# Patient Record
Sex: Male | Born: 1950 | Race: Black or African American | Hispanic: No | Marital: Single | Smoking: Current every day smoker
Health system: Southern US, Community
[De-identification: ages and names within clinical notes are randomized; demographics above are authoritative.]

## PROBLEM LIST (undated history)

## (undated) DIAGNOSIS — I639 Cerebral infarction, unspecified: Secondary | ICD-10-CM

## (undated) HISTORY — PX: TOTAL HIP ARTHROPLASTY: SHX124

---

## 1998-09-02 ENCOUNTER — Emergency Department (HOSPITAL_COMMUNITY): Admission: EM | Admit: 1998-09-02 | Discharge: 1998-09-02 | Payer: Self-pay | Admitting: Internal Medicine

## 1998-10-21 ENCOUNTER — Emergency Department (HOSPITAL_COMMUNITY): Admission: EM | Admit: 1998-10-21 | Discharge: 1998-10-21 | Payer: Self-pay | Admitting: Emergency Medicine

## 1998-11-23 ENCOUNTER — Encounter: Payer: Self-pay | Admitting: Family Medicine

## 1998-11-23 ENCOUNTER — Ambulatory Visit (HOSPITAL_COMMUNITY): Admission: RE | Admit: 1998-11-23 | Discharge: 1998-11-23 | Payer: Self-pay | Admitting: Family Medicine

## 1999-02-17 ENCOUNTER — Ambulatory Visit: Admission: RE | Admit: 1999-02-17 | Discharge: 1999-02-17 | Payer: Self-pay | Admitting: Pulmonary Disease

## 1999-11-20 ENCOUNTER — Emergency Department (HOSPITAL_COMMUNITY): Admission: EM | Admit: 1999-11-20 | Discharge: 1999-11-20 | Payer: Self-pay | Admitting: *Deleted

## 2002-07-24 ENCOUNTER — Emergency Department (HOSPITAL_COMMUNITY): Admission: EM | Admit: 2002-07-24 | Discharge: 2002-07-24 | Payer: Self-pay | Admitting: Emergency Medicine

## 2002-07-25 ENCOUNTER — Encounter: Payer: Self-pay | Admitting: Emergency Medicine

## 2002-07-25 ENCOUNTER — Emergency Department (HOSPITAL_COMMUNITY): Admission: EM | Admit: 2002-07-25 | Discharge: 2002-07-25 | Payer: Self-pay | Admitting: Emergency Medicine

## 2003-02-01 ENCOUNTER — Encounter: Payer: Self-pay | Admitting: Family Medicine

## 2003-02-01 ENCOUNTER — Ambulatory Visit (HOSPITAL_COMMUNITY): Admission: RE | Admit: 2003-02-01 | Discharge: 2003-02-01 | Payer: Self-pay | Admitting: Family Medicine

## 2004-01-23 ENCOUNTER — Emergency Department (HOSPITAL_COMMUNITY): Admission: EM | Admit: 2004-01-23 | Discharge: 2004-01-23 | Payer: Self-pay | Admitting: Emergency Medicine

## 2004-03-30 ENCOUNTER — Inpatient Hospital Stay (HOSPITAL_COMMUNITY): Admission: EM | Admit: 2004-03-30 | Discharge: 2004-04-05 | Payer: Self-pay | Admitting: Emergency Medicine

## 2004-03-30 ENCOUNTER — Ambulatory Visit: Payer: Self-pay | Admitting: Oncology

## 2004-04-05 ENCOUNTER — Ambulatory Visit: Payer: Self-pay | Admitting: Oncology

## 2004-04-05 ENCOUNTER — Inpatient Hospital Stay (HOSPITAL_COMMUNITY)
Admission: RE | Admit: 2004-04-05 | Discharge: 2004-04-20 | Payer: Self-pay | Admitting: Physical Medicine & Rehabilitation

## 2004-04-05 ENCOUNTER — Ambulatory Visit: Payer: Self-pay | Admitting: Physical Medicine & Rehabilitation

## 2004-05-24 ENCOUNTER — Ambulatory Visit: Payer: Self-pay | Admitting: Oncology

## 2004-05-25 ENCOUNTER — Encounter
Admission: RE | Admit: 2004-05-25 | Discharge: 2004-08-23 | Payer: Self-pay | Admitting: Physical Medicine & Rehabilitation

## 2004-05-26 ENCOUNTER — Ambulatory Visit: Payer: Self-pay | Admitting: Physical Medicine & Rehabilitation

## 2004-07-09 ENCOUNTER — Ambulatory Visit: Payer: Self-pay | Admitting: Oncology

## 2004-08-25 ENCOUNTER — Ambulatory Visit (HOSPITAL_COMMUNITY): Payer: Self-pay | Admitting: Psychiatry

## 2004-08-27 ENCOUNTER — Ambulatory Visit: Payer: Self-pay | Admitting: Oncology

## 2004-10-25 ENCOUNTER — Ambulatory Visit: Payer: Self-pay | Admitting: Oncology

## 2004-11-24 ENCOUNTER — Encounter
Admission: RE | Admit: 2004-11-24 | Discharge: 2005-02-22 | Payer: Self-pay | Admitting: Physical Medicine & Rehabilitation

## 2004-12-13 ENCOUNTER — Ambulatory Visit: Payer: Self-pay | Admitting: Oncology

## 2005-01-28 ENCOUNTER — Ambulatory Visit: Payer: Self-pay | Admitting: Oncology

## 2005-04-01 ENCOUNTER — Ambulatory Visit: Payer: Self-pay | Admitting: Oncology

## 2005-05-20 ENCOUNTER — Ambulatory Visit: Payer: Self-pay | Admitting: Oncology

## 2005-08-22 ENCOUNTER — Ambulatory Visit: Payer: Self-pay | Admitting: Oncology

## 2005-08-26 LAB — PROTIME-INR
INR: 3.1 (ref 2.00–3.50)
Protime: 21.1 Seconds — ABNORMAL HIGH (ref 10.6–13.4)

## 2005-08-26 LAB — MORPHOLOGY

## 2005-08-26 LAB — CBC WITH DIFFERENTIAL/PLATELET
Basophils Absolute: 0.1 10*3/uL (ref 0.0–0.1)
EOS%: 1.2 % (ref 0.0–7.0)
HCT: 40.9 % (ref 38.7–49.9)
HGB: 13.8 g/dL (ref 13.0–17.1)
MONO#: 0.6 10*3/uL (ref 0.1–0.9)
NEUT#: 1.5 10*3/uL (ref 1.5–6.5)
NEUT%: 32.9 % — ABNORMAL LOW (ref 40.0–75.0)
RDW: 16 % — ABNORMAL HIGH (ref 11.2–14.6)
WBC: 4.5 10*3/uL (ref 4.0–10.0)
lymph#: 2.3 10*3/uL (ref 0.9–3.3)

## 2005-08-30 LAB — COMPREHENSIVE METABOLIC PANEL
Albumin: 4.5 g/dL (ref 3.5–5.2)
BUN: 17 mg/dL (ref 6–23)
CO2: 22 mEq/L (ref 19–32)
Calcium: 9 mg/dL (ref 8.4–10.5)
Chloride: 104 mEq/L (ref 96–112)
Glucose, Bld: 85 mg/dL (ref 70–99)
Potassium: 3.6 mEq/L (ref 3.5–5.3)
Sodium: 142 mEq/L (ref 135–145)
Total Protein: 7.3 g/dL (ref 6.0–8.3)

## 2005-09-01 LAB — JAK2 GENOTYPR

## 2005-10-05 ENCOUNTER — Ambulatory Visit: Payer: Self-pay | Admitting: Oncology

## 2005-10-07 LAB — CBC WITH DIFFERENTIAL/PLATELET
BASO%: 0.6 % (ref 0.0–2.0)
EOS%: 0.8 % (ref 0.0–7.0)
HCT: 40.3 % (ref 38.7–49.9)
MCH: 33.8 pg — ABNORMAL HIGH (ref 28.0–33.4)
MCHC: 33.8 g/dL (ref 32.0–35.9)
NEUT%: 45.4 % (ref 40.0–75.0)
RDW: 17.5 % — ABNORMAL HIGH (ref 11.2–14.6)
lymph#: 1.8 10*3/uL (ref 0.9–3.3)

## 2005-10-07 LAB — PROTIME-INR

## 2005-10-07 LAB — PROTHROMBIN TIME
INR: 4.3 — ABNORMAL HIGH (ref 0.0–1.5)
Prothrombin Time: 40.8 seconds — ABNORMAL HIGH (ref 11.6–15.2)

## 2005-10-07 LAB — MORPHOLOGY

## 2005-10-17 LAB — PROTHROMBIN TIME
INR: 6.1 (ref 0.0–1.5)
Prothrombin Time: 54.1 seconds — ABNORMAL HIGH (ref 11.6–15.2)

## 2005-10-17 LAB — PROTIME-INR

## 2005-10-19 LAB — PROTIME-INR: Protime: 24.2 Seconds — ABNORMAL HIGH (ref 10.6–13.4)

## 2005-10-28 LAB — PROTIME-INR: INR: 1.3 — ABNORMAL LOW (ref 2.00–3.50)

## 2005-10-31 LAB — PROTIME-INR: INR: 4 (ref 2.00–3.50)

## 2005-11-29 ENCOUNTER — Ambulatory Visit: Payer: Self-pay | Admitting: Oncology

## 2005-12-02 LAB — CBC WITH DIFFERENTIAL/PLATELET
BASO%: 0.7 % (ref 0.0–2.0)
EOS%: 0.7 % (ref 0.0–7.0)
HCT: 43.2 % (ref 38.7–49.9)
LYMPH%: 52.4 % — ABNORMAL HIGH (ref 14.0–48.0)
MCH: 34 pg — ABNORMAL HIGH (ref 28.0–33.4)
MCHC: 34.3 g/dL (ref 32.0–35.9)
MCV: 99.1 fL — ABNORMAL HIGH (ref 81.6–98.0)
MONO%: 12 % (ref 0.0–13.0)
NEUT%: 34.2 % — ABNORMAL LOW (ref 40.0–75.0)
Platelets: 239 10*3/uL (ref 145–400)
RBC: 4.36 10*6/uL (ref 4.20–5.71)
WBC: 4.1 10*3/uL (ref 4.0–10.0)

## 2005-12-02 LAB — PROTIME-INR

## 2005-12-02 LAB — MORPHOLOGY: PLT EST: ADEQUATE

## 2005-12-06 LAB — PROTIME-INR
INR: 1.3 — ABNORMAL LOW (ref 2.00–3.50)
Protime: 13.9 Seconds — ABNORMAL HIGH (ref 10.6–13.4)

## 2005-12-09 LAB — PROTIME-INR: Protime: 16.2 Seconds — ABNORMAL HIGH (ref 10.6–13.4)

## 2005-12-12 LAB — PROTIME-INR: Protime: 16.5 Seconds — ABNORMAL HIGH (ref 10.6–13.4)

## 2005-12-19 LAB — PROTIME-INR: INR: 3 (ref 2.00–3.50)

## 2005-12-23 LAB — PROTIME-INR

## 2005-12-23 LAB — PROTHROMBIN TIME
INR: 3.9 — ABNORMAL HIGH (ref 0.0–1.5)
Prothrombin Time: 40.6 seconds — ABNORMAL HIGH (ref 11.6–15.2)

## 2006-02-07 ENCOUNTER — Ambulatory Visit: Payer: Self-pay | Admitting: Oncology

## 2006-02-07 LAB — COMPREHENSIVE METABOLIC PANEL
ALT: 17 U/L (ref 0–40)
AST: 29 U/L (ref 0–37)
Albumin: 4.5 g/dL (ref 3.5–5.2)
CO2: 23 mEq/L (ref 19–32)
Calcium: 9 mg/dL (ref 8.4–10.5)
Chloride: 108 mEq/L (ref 96–112)
Creatinine, Ser: 0.89 mg/dL (ref 0.40–1.50)
Potassium: 3.6 mEq/L (ref 3.5–5.3)

## 2006-02-07 LAB — CBC WITH DIFFERENTIAL/PLATELET
BASO%: 1.7 % (ref 0.0–2.0)
LYMPH%: 62.7 % — ABNORMAL HIGH (ref 14.0–48.0)
MCHC: 33.7 g/dL (ref 32.0–35.9)
MONO#: 0.4 10*3/uL (ref 0.1–0.9)
Platelets: 243 10*3/uL (ref 145–400)
RBC: 3.88 10*6/uL — ABNORMAL LOW (ref 4.20–5.71)
RDW: 17.8 % — ABNORMAL HIGH (ref 11.2–14.6)
WBC: 4 10*3/uL (ref 4.0–10.0)

## 2006-02-07 LAB — MORPHOLOGY

## 2006-02-07 LAB — LACTATE DEHYDROGENASE: LDH: 191 U/L (ref 94–250)

## 2006-03-30 ENCOUNTER — Ambulatory Visit: Payer: Self-pay | Admitting: Oncology

## 2006-06-15 ENCOUNTER — Ambulatory Visit: Payer: Self-pay | Admitting: Oncology

## 2006-12-27 ENCOUNTER — Ambulatory Visit: Payer: Self-pay | Admitting: Oncology

## 2007-10-15 ENCOUNTER — Ambulatory Visit: Payer: Self-pay | Admitting: Oncology

## 2007-11-05 LAB — COMPREHENSIVE METABOLIC PANEL
ALT: 36 U/L (ref 0–53)
AST: 58 U/L — ABNORMAL HIGH (ref 0–37)
Albumin: 5 g/dL (ref 3.5–5.2)
Alkaline Phosphatase: 84 U/L (ref 39–117)
Chloride: 96 mEq/L (ref 96–112)
Potassium: 3.9 mEq/L (ref 3.5–5.3)
Sodium: 137 mEq/L (ref 135–145)
Total Protein: 7.6 g/dL (ref 6.0–8.3)

## 2007-11-05 LAB — CBC WITH DIFFERENTIAL/PLATELET
Basophils Absolute: 0 10*3/uL (ref 0.0–0.1)
EOS%: 0.8 % (ref 0.0–7.0)
Eosinophils Absolute: 0 10*3/uL (ref 0.0–0.5)
HGB: 14.6 g/dL (ref 13.0–17.1)
LYMPH%: 45.9 % (ref 14.0–48.0)
MCH: 35.3 pg — ABNORMAL HIGH (ref 28.0–33.4)
MCV: 101.6 fL — ABNORMAL HIGH (ref 81.6–98.0)
MONO%: 8.7 % (ref 0.0–13.0)
NEUT#: 1.6 10*3/uL (ref 1.5–6.5)
Platelets: 215 10*3/uL (ref 145–400)
RBC: 4.13 10*6/uL — ABNORMAL LOW (ref 4.20–5.71)

## 2007-11-05 LAB — PROTIME-INR
INR: 1 — ABNORMAL LOW (ref 2.00–3.50)
Protime: 12 Seconds (ref 10.6–13.4)

## 2007-11-23 LAB — CBC WITH DIFFERENTIAL/PLATELET
BASO%: 0.1 % (ref 0.0–2.0)
Basophils Absolute: 0 10*3/uL (ref 0.0–0.1)
EOS%: 0.8 % (ref 0.0–7.0)
HCT: 40 % (ref 38.7–49.9)
HGB: 13.9 g/dL (ref 13.0–17.1)
LYMPH%: 47.5 % (ref 14.0–48.0)
MCH: 34.6 pg — ABNORMAL HIGH (ref 28.0–33.4)
MCHC: 34.8 g/dL (ref 32.0–35.9)
MCV: 99.4 fL — ABNORMAL HIGH (ref 81.6–98.0)
MONO%: 7.7 % (ref 0.0–13.0)
NEUT%: 43.9 % (ref 40.0–75.0)
lymph#: 2.3 10*3/uL (ref 0.9–3.3)

## 2007-11-23 LAB — PROTIME-INR

## 2007-11-27 ENCOUNTER — Ambulatory Visit: Payer: Self-pay | Admitting: Oncology

## 2007-12-07 LAB — COMPREHENSIVE METABOLIC PANEL
ALT: 25 U/L (ref 0–53)
AST: 39 U/L — ABNORMAL HIGH (ref 0–37)
Albumin: 4.7 g/dL (ref 3.5–5.2)
Alkaline Phosphatase: 72 U/L (ref 39–117)
BUN: 12 mg/dL (ref 6–23)
Chloride: 103 mEq/L (ref 96–112)
Potassium: 3.7 mEq/L (ref 3.5–5.3)

## 2007-12-07 LAB — CBC WITH DIFFERENTIAL/PLATELET
Basophils Absolute: 0 10*3/uL (ref 0.0–0.1)
Eosinophils Absolute: 0 10*3/uL (ref 0.0–0.5)
HCT: 37.8 % — ABNORMAL LOW (ref 38.7–49.9)
HGB: 12.8 g/dL — ABNORMAL LOW (ref 13.0–17.1)
LYMPH%: 47.5 % (ref 14.0–48.0)
MCV: 100.4 fL — ABNORMAL HIGH (ref 81.6–98.0)
MONO#: 0.4 10*3/uL (ref 0.1–0.9)
MONO%: 8.9 % (ref 0.0–13.0)
NEUT#: 1.8 10*3/uL (ref 1.5–6.5)
NEUT%: 42.3 % (ref 40.0–75.0)
Platelets: 255 10*3/uL (ref 145–400)
RBC: 3.76 10*6/uL — ABNORMAL LOW (ref 4.20–5.71)
WBC: 4.2 10*3/uL (ref 4.0–10.0)

## 2007-12-07 LAB — PROTIME-INR
INR: 2.7 (ref 2.00–3.50)
Protime: 32.4 Seconds — ABNORMAL HIGH (ref 10.6–13.4)

## 2007-12-07 LAB — MORPHOLOGY

## 2008-01-03 ENCOUNTER — Ambulatory Visit: Admission: RE | Admit: 2008-01-03 | Discharge: 2008-01-03 | Payer: Self-pay | Admitting: Family Medicine

## 2008-01-30 ENCOUNTER — Ambulatory Visit: Payer: Self-pay | Admitting: Oncology

## 2009-04-14 ENCOUNTER — Ambulatory Visit: Payer: Self-pay | Admitting: Cardiology

## 2009-04-14 ENCOUNTER — Inpatient Hospital Stay (HOSPITAL_COMMUNITY): Admission: EM | Admit: 2009-04-14 | Discharge: 2009-04-17 | Payer: Self-pay | Admitting: Emergency Medicine

## 2009-04-15 ENCOUNTER — Encounter (INDEPENDENT_AMBULATORY_CARE_PROVIDER_SITE_OTHER): Payer: Self-pay | Admitting: Internal Medicine

## 2009-04-15 ENCOUNTER — Ambulatory Visit: Payer: Self-pay | Admitting: Vascular Surgery

## 2009-04-24 ENCOUNTER — Ambulatory Visit: Payer: Self-pay | Admitting: Oncology

## 2009-05-20 ENCOUNTER — Ambulatory Visit: Payer: Self-pay | Admitting: Oncology

## 2010-08-17 LAB — CBC
Hemoglobin: 13.5 g/dL (ref 13.0–17.0)
MCHC: 34.4 g/dL (ref 30.0–36.0)
MCV: 106.7 fL — ABNORMAL HIGH (ref 78.0–100.0)
Platelets: 185 10*3/uL (ref 150–400)
RBC: 3.77 MIL/uL — ABNORMAL LOW (ref 4.22–5.81)

## 2010-08-17 LAB — PROTIME-INR
INR: 2.25 — ABNORMAL HIGH (ref 0.00–1.49)
Prothrombin Time: 19 seconds — ABNORMAL HIGH (ref 11.6–15.2)
Prothrombin Time: 24.7 seconds — ABNORMAL HIGH (ref 11.6–15.2)

## 2010-08-17 LAB — BASIC METABOLIC PANEL
BUN: 7 mg/dL (ref 6–23)
CO2: 26 mEq/L (ref 19–32)
CO2: 29 mEq/L (ref 19–32)
Calcium: 9.1 mg/dL (ref 8.4–10.5)
Chloride: 102 mEq/L (ref 96–112)
Creatinine, Ser: 0.89 mg/dL (ref 0.4–1.5)
GFR calc Af Amer: >60 mL/min (ref >60)
GFR calc non Af Amer: >60 mL/min (ref >60)
Sodium: 141 mEq/L (ref 135–145)

## 2010-08-17 LAB — LIPID PANEL
Cholesterol: 200 mg/dL (ref 0–200)
LDL Cholesterol: 131 mg/dL — ABNORMAL HIGH (ref 0–99)
Triglycerides: 49 mg/dL (ref <150)

## 2010-08-18 LAB — PROTIME-INR
INR: 2.06 — ABNORMAL HIGH (ref 0.00–1.49)
Prothrombin Time: 23 seconds — ABNORMAL HIGH (ref 11.6–15.2)

## 2010-08-18 LAB — URINE MICROSCOPIC-ADD ON

## 2010-08-18 LAB — CBC
MCHC: 33.8 g/dL (ref 30.0–36.0)
Platelets: 199 10*3/uL (ref 150–400)
RBC: 3.98 MIL/uL — ABNORMAL LOW (ref 4.22–5.81)

## 2010-08-18 LAB — URINALYSIS, ROUTINE W REFLEX MICROSCOPIC
Glucose, UA: NEGATIVE mg/dL
Ketones, ur: >80 mg/dL — AB
Leukocytes, UA: NEGATIVE
Specific Gravity, Urine: 1.029 (ref 1.005–1.030)
pH: 6 (ref 5.0–8.0)

## 2010-08-18 LAB — COMPREHENSIVE METABOLIC PANEL
BUN: 11 mg/dL (ref 6–23)
Calcium: 9.4 mg/dL (ref 8.4–10.5)
Creatinine, Ser: 0.97 mg/dL (ref 0.4–1.5)
GFR calc non Af Amer: >60 mL/min (ref >60)
Glucose, Bld: 69 mg/dL — ABNORMAL LOW (ref 70–99)
Sodium: 141 mEq/L (ref 135–145)
Total Protein: 7.6 g/dL (ref 6.0–8.3)

## 2010-08-18 LAB — DIFFERENTIAL
Lymphocytes Relative: 45 % (ref 12–46)
Lymphs Abs: 1.5 10*3/uL (ref 0.7–4.0)
Monocytes Relative: 13 % — ABNORMAL HIGH (ref 3–12)
Neutro Abs: 1.4 10*3/uL — ABNORMAL LOW (ref 1.7–7.7)
Neutrophils Relative %: 41 % — ABNORMAL LOW (ref 43–77)

## 2010-08-18 LAB — CK TOTAL AND CKMB (NOT AT ARMC)
CK, MB: 1.5 ng/mL (ref 0.3–4.0)
Total CK: 122 U/L (ref 7–232)

## 2010-08-18 LAB — APTT: aPTT: 33 seconds (ref 24–37)

## 2010-08-18 LAB — HEMOGLOBIN A1C
Hgb A1c MFr Bld: 5.4 % (ref 4.6–6.1)
Mean Plasma Glucose: 108 mg/dL

## 2010-09-28 ENCOUNTER — Inpatient Hospital Stay (HOSPITAL_COMMUNITY)
Admission: EM | Admit: 2010-09-28 | Discharge: 2010-10-03 | DRG: 481 | Discharge status: Against Medical Advice | Payer: Medicare Other | Attending: Internal Medicine | Admitting: Internal Medicine

## 2010-09-28 ENCOUNTER — Emergency Department (HOSPITAL_COMMUNITY): Payer: Medicare Other

## 2010-09-28 DIAGNOSIS — Y92009 Unspecified place in unspecified non-institutional (private) residence as the place of occurrence of the external cause: Secondary | ICD-10-CM

## 2010-09-28 DIAGNOSIS — F1010 Alcohol abuse, uncomplicated: Secondary | ICD-10-CM | POA: Diagnosis present

## 2010-09-28 DIAGNOSIS — Z8673 Personal history of transient ischemic attack (TIA), and cerebral infarction without residual deficits: Secondary | ICD-10-CM

## 2010-09-28 DIAGNOSIS — W010XXA Fall on same level from slipping, tripping and stumbling without subsequent striking against object, initial encounter: Secondary | ICD-10-CM | POA: Diagnosis present

## 2010-09-28 DIAGNOSIS — D696 Thrombocytopenia, unspecified: Secondary | ICD-10-CM | POA: Diagnosis present

## 2010-09-28 DIAGNOSIS — F17200 Nicotine dependence, unspecified, uncomplicated: Secondary | ICD-10-CM | POA: Diagnosis present

## 2010-09-28 DIAGNOSIS — D62 Acute posthemorrhagic anemia: Secondary | ICD-10-CM | POA: Diagnosis not present

## 2010-09-28 DIAGNOSIS — E876 Hypokalemia: Secondary | ICD-10-CM | POA: Diagnosis not present

## 2010-09-28 DIAGNOSIS — D45 Polycythemia vera: Secondary | ICD-10-CM | POA: Diagnosis present

## 2010-09-28 DIAGNOSIS — J322 Chronic ethmoidal sinusitis: Secondary | ICD-10-CM | POA: Diagnosis present

## 2010-09-28 DIAGNOSIS — Z91199 Patient's noncompliance with other medical treatment and regimen due to unspecified reason: Secondary | ICD-10-CM

## 2010-09-28 DIAGNOSIS — Z86718 Personal history of other venous thrombosis and embolism: Secondary | ICD-10-CM

## 2010-09-28 DIAGNOSIS — Z7901 Long term (current) use of anticoagulants: Secondary | ICD-10-CM

## 2010-09-28 DIAGNOSIS — Z9119 Patient's noncompliance with other medical treatment and regimen: Secondary | ICD-10-CM

## 2010-09-28 DIAGNOSIS — S7292XA Unspecified fracture of left femur, initial encounter for closed fracture: Secondary | ICD-10-CM

## 2010-09-28 DIAGNOSIS — S72143A Displaced intertrochanteric fracture of unspecified femur, initial encounter for closed fracture: Principal | ICD-10-CM | POA: Diagnosis present

## 2010-09-28 LAB — ABO/RH: ABO/RH(D): A POS

## 2010-09-28 LAB — CBC
MCH: 34.1 pg — ABNORMAL HIGH (ref 26.0–34.0)
MCV: 95.5 fL (ref 78.0–100.0)
Platelets: 185 10*3/uL (ref 150–400)
RBC: 3.75 MIL/uL — ABNORMAL LOW (ref 4.22–5.81)
RDW: 14.6 % (ref 11.5–15.5)
WBC: 4.5 10*3/uL (ref 4.0–10.5)

## 2010-09-28 LAB — APTT: aPTT: 26 seconds (ref 24–37)

## 2010-09-28 LAB — DIFFERENTIAL
Basophils Absolute: 0 10*3/uL (ref 0.0–0.1)
Eosinophils Absolute: 0 10*3/uL (ref 0.0–0.7)
Lymphocytes Relative: 40 % (ref 12–46)
Monocytes Absolute: 0.4 10*3/uL (ref 0.1–1.0)
Neutrophils Relative %: 51 % (ref 43–77)

## 2010-09-28 LAB — COMPREHENSIVE METABOLIC PANEL
ALT: 49 U/L (ref 0–53)
AST: 85 U/L — ABNORMAL HIGH (ref 0–37)
CO2: 26 mEq/L (ref 19–32)
Calcium: 8.8 mg/dL (ref 8.4–10.5)
Chloride: 101 mEq/L (ref 96–112)
GFR calc Af Amer: >60 mL/min (ref >60)
GFR calc non Af Amer: >60 mL/min (ref >60)
Glucose, Bld: 101 mg/dL — ABNORMAL HIGH (ref 70–99)
Sodium: 140 mEq/L (ref 135–145)
Total Bilirubin: 0.5 mg/dL (ref 0.3–1.2)

## 2010-09-28 LAB — PROTIME-INR: INR: 0.98 (ref 0.00–1.49)

## 2010-09-28 LAB — TYPE AND SCREEN: ABO/RH(D): A POS

## 2010-09-29 ENCOUNTER — Inpatient Hospital Stay (HOSPITAL_COMMUNITY): Payer: Medicare Other

## 2010-09-29 LAB — SURGICAL PCR SCREEN
MRSA, PCR: NEGATIVE
Staphylococcus aureus: NEGATIVE

## 2010-09-29 LAB — URINE MICROSCOPIC-ADD ON

## 2010-09-29 LAB — URINALYSIS, ROUTINE W REFLEX MICROSCOPIC
Glucose, UA: NEGATIVE mg/dL
Hgb urine dipstick: NEGATIVE
Ketones, ur: 15 mg/dL — AB
Protein, ur: NEGATIVE mg/dL
Urobilinogen, UA: 1 mg/dL (ref 0.0–1.0)

## 2010-09-29 NOTE — H&P (Signed)
[name], [name]                 ACCOUNT NO.:  [id number]  MEDICAL RECORD NO.:  [id number]           PATIENT TYPE:  E  LOCATION:  [facility]                         FACILITY:  [facility]  PHYSICIAN:  Jonny Ruiz, MD    DATE OF BIRTH:  04/10/1951  DATE OF ADMISSION:  09/28/2010 DATE OF DISCHARGE:                             HISTORY & PHYSICAL   PRIMARY CARE PHYSICIAN:  Dr. Ronne Binning.  HEMATOLOGIST:  Levert Feinstein, MD, FACP  CHIEF COMPLAINT:  Hip fracture.  HISTORY OF PRESENT ILLNESS:  This is a 60 year old African American man who came to the hospital with a chief complaint of a fall and evaluation in the ED revealed a left hip fracture.  The patient's injury occurred prior to arrival to the ED.  The patient said that he got up and tripped, and fell, no head injuries.  Denies prior headache, dizziness, chest pain, or palpitations.  The patient has no history of falls.  The patient noticed severe pain on the left hip and inability to ambulate soon after the fall.  The patient had already received morphine sulfate. By the time I saw him, however, his pain was still at a scale of 6/10.  PAST MEDICAL HISTORY: 1. Admitted on May 04, 2009, for generalized weakness with a     negative evaluation for cerebrovascular accident. 2. Chronic leukopenia/polycythemia vera.  Hypercoagulable. 3. Stroke 2010 on warfarin therapy. 4. Superficial venous thrombus with bilateral subarachnoid hemorrhages     and right lower extremity DVT, in 2006.  MEDICATIONS: 1. Tramadol 50 mg q.6 hours for pain. 2. Coumadin. 3. Topamax 100 mg 3 times a day.  ALLERGIES:  No known drug allergies.  FAMILY HISTORY:  The patient does not know well the health conditions of his [name] and states his other family members are healthy.  SOCIAL HISTORY:  Divorced.  [name].  Smokes a pack of cigarettes per week.  Alcohol social.  Denies drug use.  The patient is on disability.  REVIEW OF  SYSTEMS:  CONSTITUTIONAL:  The patient has been feeling well lately and denies fever, chills, or night sweats.  No anorexia, fatigue, or malaise.  No weight loss or weight gain. ENT:  Denies tinnitus or vertigo.  No rhinorrhea or epistaxis.  No sore throat. CARDIOVASCULAR SYSTEM:  Denies chest pain, shortness of breath, palpitations, orthopnea, nocturia, or PND, although it is precipitated by coma. RESPIRATORY:  Denies cough, shortness of breath, wheezing, or recent infections. GASTROINTESTINAL:  Denies anorexia, nausea, vomiting, diarrhea, or constipation.  No hematochezia or melena. GENITOURINARY:  Admits frequent feeling of urination.  Denies dysuria, nocturia, dribbling, or hesitancy.  PHYSICAL EXAMINATION:  VITAL SIGNS:  Temperature 98.3, heart rate 105, blood pressure 104/64, and respirations 16. GENERAL APPEARANCE:  Thin black male in no acute distress; however, appears to be in pain. HEENT:  Unremarkable. NECK:  Supple without JVD.  No lymphadenopathy or masses palpable. HEART:  Regular S1 and S2 without gallops, murmurs, or rubs. LUNGS:  Clear to auscultation. ABDOMEN:  Nondistended.  Soft and nontender without organomegaly or masses palpable. EXTREMITIES:  Left hip is shortened and rotated.  NEUROLOGIC:  Cranial nerves II through XII intact.  No motor deficits in the upper extremities.  Right patellar reflexes normal.  No Babinski.  LABORATORY ASSESSMENT:  INR 0.98.  Basic metabolic panel normal except for albumin 3.6.  IMPRESSION: 1. Hip fracture. 2. Fall, appears to be accidental. 3. History of stroke. 4. Polycythemia vera with anticoagulation.  PLAN:  The patient will be admitted to inpatient.  He will be assigned to team IV.  He is a full code.  Vitals will be taken per shift.  He will be bedrest for the next 24 hours.  Consultation with ortho already placed by ED physician.  Obtain CBC and TSH.  UA for analgesia.  The patient will be on Tylenol 650 mg q.4 hours  for mild pain with hydrocodone 2 tabs q.4 p.r.n. for pain.  He might receive Dilaudid 1 mg q.4 IV p.r.n. severe pain.          ______________________________ Jonny Ruiz, MD     GL/MEDQ  D:  09/28/2010  T:  09/28/2010  Job:  [id number]  Electronically Signed by Jonny Ruiz MD on 09/29/2010 03:17:00 PM

## 2010-09-30 ENCOUNTER — Inpatient Hospital Stay (HOSPITAL_COMMUNITY): Payer: Medicare Other

## 2010-09-30 LAB — CBC
MCV: 96.8 fL (ref 78.0–100.0)
Platelets: 132 10*3/uL — ABNORMAL LOW (ref 150–400)
RBC: 2.8 MIL/uL — ABNORMAL LOW (ref 4.22–5.81)
RDW: 14 % (ref 11.5–15.5)
WBC: 5.5 10*3/uL (ref 4.0–10.5)

## 2010-10-01 ENCOUNTER — Inpatient Hospital Stay (HOSPITAL_COMMUNITY): Payer: Medicare Other

## 2010-10-01 LAB — BASIC METABOLIC PANEL
CO2: 30 mEq/L (ref 19–32)
Calcium: 8.7 mg/dL (ref 8.4–10.5)
GFR calc Af Amer: >60 mL/min (ref >60)
GFR calc non Af Amer: >60 mL/min (ref >60)
Potassium: 3.1 mEq/L — ABNORMAL LOW (ref 3.5–5.1)
Sodium: 135 mEq/L (ref 135–145)

## 2010-10-01 LAB — CBC
HCT: 23 % — ABNORMAL LOW (ref 39.0–52.0)
Hemoglobin: 8.3 g/dL — ABNORMAL LOW (ref 13.0–17.0)
MCHC: 36.1 g/dL — ABNORMAL HIGH (ref 30.0–36.0)
WBC: 4.8 10*3/uL (ref 4.0–10.5)

## 2010-10-01 NOTE — Discharge Summary (Signed)
NAMEKAULANA, [name]                 ACCOUNT NO.:  [id number]   MEDICAL RECORD NO.:  [id number]          PATIENT TYPE:  IPS   LOCATION:  4038                         FACILITY:  [facility]   PHYSICIAN:  Ellwood Dense, M.D.   DATE OF BIRTH:  04/10/1951   DATE OF ADMISSION:  04/05/2004  DATE OF DISCHARGE:  04/20/2004                                 DISCHARGE SUMMARY   DISCHARGE DIAGNOSES:  1.  Right parietal supravenous thrombus/SAH.  2.  Right deep venous thrombosis.  3.  Polycythemia vera.  4.  Elevated homocysteine.  5.  Neuropathy.  6.  Seizure prophylaxis.  7.  Tobacco abuse.   HISTORY AND PHYSICAL:  Patient is a 60 year old black male right-handed.  Past history unremarkable.  Presents on March 30, 2004 with left side  numbness, weakness, and seizures.  MRI revealed right parietal superficial  venous sinus thrombus with bilateral hemorrhage/SAH.  Hospital course also  significant for right lower extremity venous thrombus.  Patient evaluated by  hematology to rule out hypercoagulability therapy.  Patient also presents  with elevated hematocrit on admission.  Diagnosed with hypercoagulability  but most heme studies unknown at this time.  Patient presently on heparin  and Coumadin for CVA prophylaxis.  PT report at this time indicates patient  is ambulating a couple of steps, transfer min assist, bed mobility min  assist.  Patient was phlebotomized due to elevated hematocrit several times  prior to admission to rehabilitation.  Patient also has significant numbness  in right foot and right leg.  Patient was transferred to [facility] Department on April 05, 2004 for more therapies.   REVIEW OF SYSTEMS:  Significant for edema, weakness, numbness, seizures,  insomnia.  Denies any chest pain, shortness of breath.   PAST MEDICAL HISTORY:  Unremarkable except tobacco abuse.   FAMILY HISTORY:  Denies any cardiovascular disease or stroke.   SOCIAL HISTORY:  Patient  lives alone in one level home with three steps to  entry.  Independent prior to admission.  He smokes one pack to one-half pack  a day.  Occasional alcohol.   MEDICATIONS:  None.   ALLERGIES:  None.   HOSPITAL COURSE:  Mr. [name] was admitted to [facility]  Department on April 05, 2004, for comprehensive inpatient rehabilitation  and received more than three hours of therapy daily.   #1 - RIGHT PARIETAL SUPERFICIAL THROMBUS/SAH:  Patient was able to tolerate  therapies very well while in rehabilitation.  He remained on heparin until  Coumadin was therapeutic.  Patient had no significant bleeding complications  while on Coumadin.  Patient continued to have significant numbness and  tingling sensation in hands and legs.  He was started on Topamax 50 mg  q.h.s. x1 week, then b.i.d.  Topamax was eventually increased to 50 mg  b.i.d. on April 10, 2004 due to minimal improvement with numbness and  tingling.  Patient experienced no new weaknesses while in rehabilitation.  Patient is discharged to overall modified independent level.  Able to  ambulate with rolling walker 100 feet.  Able to transfer  modified  independent level and bed mobility modified independent level.  Patient able  to perform most ADLs supervision to modified independent level.  Patient had  no significant aphasia.  Patient was discharged home in overall modified  independent level.   #2 - HEMATOLOGY:  Patient was finally diagnosed with polycythemia vera for  Dr. Cyndie Chime.  Patient has had to be phlebotomized at least twice while  on rehabilitation.  Dr. Cyndie Chime will follow patient closely at  outpatient level.  He will also monitor the Coumadin.  Discharge hemoglobin  was 16.7.  Discharge hematocrit was 48.6 performed on December 2005.  Latest  phlebotomy procedure was performed on April 12, 2004.  Next draw for INR  for Coumadin will be performed on Friday, April 22, 2004.   #3 -  SEIZURE PROPHYLAXIS:  Patient experienced no seizures while in  rehabilitation.  Remained on Dilantin 200 mg p.o. b.i.d.   #4 - ELEVATED HOMOCYSTEINE:  Remain on __________ one tablet p.o. daily.   #5 - Insomnia.  The patient had no significant other complications or  problems while in rehabilitation.  Patient also received Ultram p.o. q.6-8h.  50 mg as needed for pain.   Latest white blood cell count 4.3.  Latest hemoglobin 16.7, hematocrit 48.2.  Latest INR 1.9 performed on December 2005.  Platelet count 257.  Sodium 136,  potassium 3.4, chloride 104, CO2 37, glucose 84, BUN 6, creatinine 0.8.  Dilantin level performed latest on April 06, 2004 was 13.3.  Urine  cultured performed on April 08, 2004:  No growth x1 day.   PT report at time of discharge indicate patient __________ is modified  independent level, ambulating greater than 120 feet with rolling walker.  Patient met all goals from a rehabilitation standpoint.  Basic comprehension  within normal limits.  Long-term memory within normal limits.  Patient  maintained all long-term goals but recommend outpatient therapy for trunk  and upper extremity strengthening.  Patient was discharged home with family.  Discharge vitals were stable blood pressure 117/193, temperature 97.1, pulse  82, respiratory rate 20.  Patient had still some mild left lower extremity  weakness.   DISCHARGE MEDICATIONS:  1.  Coumadin 5 mg daily alternate two tablets and one and a half tablets.  2.  __________ one tablet daily.  3.  Topamax 100 mg daily.  4.  Dilantin 200 mg b.i.d.  5.  Ambien 5 mg daily at night as needed for sleep.  6.  Ultram 50-100 mg q.6-8h. as needed for pain.  7.  No aspirin, ibuprofen, or Aleve while on Coumadin.   PAIN MANAGEMENT:  Ultram.   No drinking, alcohol.  No smoking.  Use wheelchair.  Use walker.  He will have [facility] for PT and OT and INR.  First INR check on Friday  the 9th and call the results to  Dr. Cyndie Chime.  Follow up with Dr. Pearlean Brownie,  neurologist in two weeks.  Follow up Dr. Cyndie Chime in one week, Dr. Ellwood Dense May 26, 2004 at 2:45.       LB/MEDQ  D:  04/20/2004  T:  04/20/2004  Job:  [id number]   cc:   Genene Churn. Cyndie Chime, M.D.  [address] [facility]  [place]  [place] [address]  Fax: [phone]   Pramod P. Pearlean Brownie, MD  Fax: [phone]

## 2010-10-01 NOTE — Consult Note (Signed)
[name], [name]                 ACCOUNT NO.:  [id number]   MEDICAL RECORD NO.:  [id number]          PATIENT TYPE:  INP   LOCATION:  3104                         FACILITY:  [facility]   PHYSICIAN:  Genene Churn. [place], M.D.DATE OF BIRTH:  04/10/1951   DATE OF CONSULTATION:  03/31/2004  DATE OF DISCHARGE:                                   CONSULTATION   This is a hematology consultation to rule out a coagulopathy in this man who  presents with a cerebral vein thrombosis.   [name] is a 61 year old, African-American man who smokes one-half pack  of cigarettes daily.  He presented yesterday with the acute onset of left-  sided weakness and paresthesias followed by generalized seizures.  CT scan  of the brain showed an 8 x 5 mm area of hemorrhage in the right frontal  lobe.  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   There is no history of hypertension, diabetes, or prior neurologic disease.   Of note was initial CBC which showed a hemoglobin of 19.6, with hematocrit  57.4.  Repeat values of 17.8 and 52.6.  White count and platelets were  normal, with white count 7,700, 71 neutrophils, 20 lymphocytes, 9 monocytes,  and platelet count 254,000.  MCV elevated at 108.  PT and PTT normal at 12.5  and 29 seconds.  Oxygen saturations have been greater than or equal to 96%  on nasal cannula oxygen.   PAST MEDICAL HISTORY:  Remarkable for the absence of any major medical or  surgical illness.   No chronic medications.   No allergies.   FAMILY HISTORY:  He states his parents both died of natural causes when they  were elderly.  There is no family history of bleeding or clotting disorders.  He has multiple siblings with no bleeding or clotting problems to his  knowledge.   SOCIAL HISTORY:  He is divorced.  Has five children.  Smokes one-half pack  of  cigarettes daily.  Drinks rare alcohol.   PHYSICAL EXAMINATION:  GENERAL:  Well-nourished man, currently alert and  oriented.  VITAL SIGNS:  Initial vital signs in the emergency department last evening  showed blood pressure 142/83.  Subsequent blood pressures have all been  normal.  Most recent at 10:52 a.m. today 133/86; with pulse 72, regular;  respirations 18; oxygen saturation 99%.  He is afebrile.  HEENT:  Pupils equal and reactive to light.  Arcus senilis.  Optic disks are  sharp.  Vessels are normal.  No significant retinal vein distention.  No  hemorrhage or exudate.  NECK:  Supple.  There are no carotid bruits.  Carotid pulses are 1+ and  symmetric.  LUNGS:  Clear.  CARDIAC:  Regular cardiac rhythm, without murmur, gallop, or click.  LYMPHATIC:  No lymphadenopathy.  ABDOMEN:  Soft, nontender.  No mass, no organomegaly.  EXTREMITIES:  No edema, no calf tenderness.  NEUROLOGIC:  Mental status intact.  Cranial nerves grossly normal.  Motor  strength:  Trace weakness  of his left leg and left upper extremity.   ADDITIONAL LABORATORIES:  Mild elevation of bilirubin 1.5, and alkaline  phosphatase 167.   IMPRESSION:  Acute cerebral vein thrombosis, with small post-thrombotic  frontal hemorrhage in a previously healthy normotensive man.  Laboratory  shows elevated hemoglobin and hematocrit, elevated MCV, but normal white  count and platelet count.   I agree with Dr. Thad Ranger that we need to exclude the possibility of a  myeloproliferative disorder such as polycythemia vera or other congenital  coagulopathy, including the prothrombin gene mutation or factor V Leiden  mutation, both of which have been rarely associated with cerebral vein  thrombosis.   It is quite possible that what we are seeing here is secondary polycythemia  in a longstanding cigarette smoker.  Normal white blood count and platelet  count in absence of splenomegaly favor this but are not specific, and about   one-third of patients with polycythemia vera have normal white count and  platelet counts.   RECOMMENDATIONS:  I agree with heparin anticoagulation and then Coumadin  when stable.  I would give him a minimum of seven days of heparin.  I would  not start Coumadin until the risk of extending cranial hemorrhage is lower.  I would not use loading doses of Coumadin once Coumadin is started.  I would  start at a dose of 2.5 mg daily and slowly titrate to a therapeutic range.  The reason for this is not to acutely lower protein S and protein C and  precipitate more thrombosis.   I agree with checking a serum viscosity.  If the hematocrit remains equal to  or over 55% over the next 24 hours, then I would cautiously phlebotomize 1  unit of blood.   I will also check an erythropoietin level which may help to exclude  polycythemia vera if it is extremely low.  Check a carboxyhemoglobin level  to rule out smoker's polycythemia, where it would be elevated.   There is a very recent review on cerebral vein thrombosis in the [place]  Journal of Medicine, September 11, 2003 issue, which I have left on the chart  for your review.   Thank you for this consultation.      Jame   JMG/MEDQ  D:  03/31/2004  T:  04/01/2004  Job:  [id number]   cc:   Pramod P. Pearlean Brownie, MD  Fax: [phone]   Marolyn Hammock. Thad Ranger, M.D.  [address]  [address]  [place]  [place] [address]  Fax: [phone]

## 2010-10-01 NOTE — Assessment & Plan Note (Signed)
MEDICAL RECORD NUMBER:  [id number]   DATE:  May 26, 2004   [name] returns to clinic today for followup evaluation.  He is a 60-year-  old, right-handed, adult male with a history that is fairly unremarkable.  He presented March 30, 2004, with left-sided weakness, numbness, and  seizure activity.  MRI scan revealed a right parietal superficial venous  sinus thrombus along with bilateral subarachnoid hemorrhages.  Hospital  course was significant for right lower extremity venous thrombosis.  He was  evaluated by hematology to rule out hypercoagulability.  Dr. Cyndie Chime was  seeing him for erythrocytosis without thrombocytosis.  It was felt that  he  eventually was diagnosed to have polycythemia vera.   The patient eventually stabilized and was moved to the rehabilitation until  April 05, 2004.  He remained there through discharge April 20, 2004.   Since discharge, the patient has been living and receiving assistance from  an aide and also from his family.  He is receiving home health therapy, but  the last therapy was yesterday, and they have discontinued.  He continues to  see Dr. Cyndie Chime, his local hematologist.  Followup lobotomy is scheduled  for June 11, 2004, with recent blood work having been done May 24, 2004.  The patient is managed on Coumadin 12 mg daily, managed by Dr.  Cyndie Chime.  Dr. Pearlean Brownie, local neurologist, has decreased the patient's  Topamax to 100 mg daily and decreased Dilantin to 300 mg daily.  The patient  is really not sure how much benefit he is gaining from the Topamax or any of  his pain medicines.  He complains of numbness of his right arm below elbow  level and pain of his right arm above elbow level.  He does individual  bathing and dressing at the present time.   MEDICATIONS:  1.  Coumadin 12 mg daily.  2.  Topamax 100 mg daily.  3.  Dilantin 300 mg daily.  4.  Ambien 5 mg daily p.r.n.  5.  Ultram p.r.n.   PHYSICAL  EXAMINATION:  GENERAL:  Ill-appearing, middle-aged, adult male in  mild acute discomfort.  VITAL SIGNS:  Blood pressure 102/57, pulse 83, O2 saturation 97% on room  air.  EXTREMITIES:  Bilateral upper extremity exam showed 4/5 strength throughout.  Reflexes were 1+ in the right upper extremity and 1 to 2+ in the left upper  extremity.  Did complain of pain throughout his right arm region, especially  flexion at 4-/5 and knee extension at 4/5.  Left lower extremity strength  was generally 4+ to 5-/5.   IMPRESSION:  1.  Superficial venous thrombus with bilateral subarachnoid hemorrhages.  2.  Right lower extremity deep vein thrombosis  3.  Polycythemia vera.  4.  Seizure prophylaxis.   In the clinic today, I have refilled the patient's Ultram at 50 mg 1 tablet  p.o. four times a day for his pain.  Dr. Pearlean Brownie was in the process of weaning  that.  We have started him on Neurontin 100 mg nightly for 3 nights and then  increase  to 300 mg a night.  He will remain on Dilantin for seizure prophylaxis.  We  will plan on seeing the patient in followup in this office in approximately  one to two months' time.       DC/MedQ  D:  05/26/2004 15:41:16  T:  05/26/2004 16:21:54  Job #:  [id number]

## 2010-10-01 NOTE — H&P (Signed)
[name], [name]                 ACCOUNT NO.:  [id number]   MEDICAL RECORD NO.:  [id number]          PATIENT TYPE:  INP   LOCATION:  1828                         FACILITY:  [facility]   PHYSICIAN:  Michael L. Reynolds, M.D.DATE OF BIRTH:  04/10/1951   DATE OF ADMISSION:  03/30/2004  DATE OF DISCHARGE:                                HISTORY & PHYSICAL   CHIEF COMPLAINT:  Left-sided numbness and seizures.   HISTORY OF PRESENT ILLNESS:  This is the initial [facility]  admission for this 60 year old male with little past medical history. The  patient reports that this morning he felt funny with a numb sensation on  the left side involving the arm, trunk, leg, and staring face. This waxed  and waned over the course of the day, but was severe enough during the late  morning that he decided not to drive home. A few hours later at about 5 p.m.  the patient was walking to a friend's house when he suddenly felt worsening  of the funny sensation on the left side and then he felt the left leg  shaking uncontrollably. He fell to the ground and had to crawl to the door  with the left arm and leg not working. The neighbors found him in this  situation and ultimately called EMS. He may have, at that point, had a  convulsion, although it is not absolutely clear from the records. The  patient was brought to the emergency room where he reported ongoing  sensation of feeling funny on the left side. Code stroke was called. CT of  the head was performed which demonstrated an abnormality and the patient was  subsequently admitted. At approximately 7:55 p.m. in the emergency room he  again developed involuntary jerking motions of left lower extremity which  after about two minutes progressed to a witnessed generalized convulsion. He  received Ativan 2 mg and Dilantin 1 gm, and is admitted for further  evaluation. The patient denied any previous head trauma in the preceding  several days. He has  been eating and drinking normally and not felt  otherwise ill.   PAST MEDICAL HISTORY:  He denies chronic medical problems. He has been  treated for a urinary tract infection about a month ago.   FAMILY HISTORY:  He denies any stroke, heart disease, diabetes, or  hypertension.   SOCIAL HISTORY:  He smokes  a half pack of cigarettes a day. He rarely  drinks alcohol. He denies use of drugs.   ALLERGIES:  Denies.   MEDICATIONS:  None.   REVIEW OF SYSTEMS:  He says  that he has poor circulation in which he  means that he often his arms and legs are numb and cold. He also says that  his right leg has been swollen for about a week. There has been no known  trauma. He is not sure why this is the case. Otherwise, a full review of  systems is negative, except as outlined in the HPI and in the admission  nursing record.   PHYSICAL EXAMINATION:  VITAL SIGNS: Temperature  98.5, blood pressure 142/83,  pulse 97, respirations 24. O2 saturation 97% on room air.  GENERAL: This is a thin, but otherwise healthy-appearing man, supine on the  hospital bed, in no evident distress.  HEENT: Head/cranium--normocephalic and atraumatic. Oropharynx--benign.  NECK: Supple without carotid bruits.  CHEST: Clear to auscultation with decreased bowel sounds throughout.  HEART: Regular rate and rhythm without murmurs.  ABDOMEN: Normoactive bowel sounds. No hepatomegaly. He does have a pulsatile  mass in the epigastric area.  EXTREMITIES: Right lower extremity shows some swelling of the ankle. Pulses  are 2+ and symmetric.  NEUROLOGIC: Mental status--he is awake and alert. Speech is slow and not  dysarthric. He is able to answer questions and follow commands. Cranial  nerves--pupils are equal and reactive. Extraocular movements are full  without nystagmus. Visual fields are full to confrontation. Face, tongue,  and palate move normal and symmetrical. Facial sensation is equal. Shoulder  shrug is symmetric.  Motor--normal bulk; there is increased tone on the left.  Weakness bilaterally of the fingers and intrinsic muscles of the hands as  well as some weakness with dorsiflexion of the left ankle and extension of  the left great toe. Sensation--he reports decreased pinprick sensation on  the left compared to the right. Reflexes are 2+ and symmetric. Toes are  equivocal on the left and down on the right.   LABORATORY DATA:  CBC reveals white count of 6.6, hemoglobin 19.6,  hematocrit 57.4, platelet count 262,000. CMET is remarkable for an elevated  glucose of 123, elevated bilirubin of 1.5, elevated alkaline phosphatase  167. Low albumin of 3.2.  Liver enzymes are normal. Coags, urinalysis, and  urine drug screen are pending at this time. Alcohol level is negative.  CT  of the head reveals a small cerebral and subarachnoid hemorrhage of the  right frontoparietal cortex near the surface with some subarachnoid spread  over the frontal and parietal areas.   IMPRESSION:  1.  Small right brain intracerebral hemorrhage.  2.  Focal seizure secondary to above.  3.  Decreased hematocrit, questionable hypocoagulable state.   PLAN:  Admit to stepdown unit, load with Dilantin. Will need admission for  further stroke workup including MRI, possible angiogram. Stroke service to  follow. Will also receive intravenous hydration and brief workup for  hyperviscosity and hematologic consult depending on his course. Stroke  service to follow.       MLR/MEDQ  D:  03/30/2004  T:  03/30/2004  Job:  [id number]

## 2010-10-02 LAB — MAGNESIUM: Magnesium: 1.7 mg/dL (ref 1.5–2.5)

## 2010-10-02 LAB — CBC
HCT: 20.2 % — ABNORMAL LOW (ref 39.0–52.0)
Platelets: 140 10*3/uL — ABNORMAL LOW (ref 150–400)
RBC: 2.08 MIL/uL — ABNORMAL LOW (ref 4.22–5.81)
RDW: 14.2 % (ref 11.5–15.5)
WBC: 4.9 10*3/uL (ref 4.0–10.5)

## 2010-10-02 LAB — BASIC METABOLIC PANEL
Chloride: 102 mEq/L (ref 96–112)
GFR calc non Af Amer: >60 mL/min (ref >60)
Potassium: 3.7 mEq/L (ref 3.5–5.1)
Sodium: 137 mEq/L (ref 135–145)

## 2010-10-02 LAB — PROTIME-INR: INR: 1.03 (ref 0.00–1.49)

## 2010-10-03 LAB — BASIC METABOLIC PANEL
Chloride: 101 mEq/L (ref 96–112)
Creatinine, Ser: 0.62 mg/dL (ref 0.4–1.5)
GFR calc Af Amer: >60 mL/min (ref >60)
GFR calc non Af Amer: >60 mL/min (ref >60)
Potassium: 3.7 mEq/L (ref 3.5–5.1)

## 2010-10-03 LAB — CBC
HCT: 29.2 % — ABNORMAL LOW (ref 39.0–52.0)
Hemoglobin: 10.7 g/dL — ABNORMAL LOW (ref 13.0–17.0)
WBC: 6.3 10*3/uL (ref 4.0–10.5)

## 2010-10-03 LAB — CROSSMATCH
Antibody Screen: NEGATIVE
Unit division: 0

## 2010-10-03 LAB — PROTIME-INR
INR: 1.4 (ref 0.00–1.49)
Prothrombin Time: 17.4 seconds — ABNORMAL HIGH (ref 11.6–15.2)

## 2010-10-08 LAB — CULTURE, BLOOD (SINGLE): Culture: NO GROWTH

## 2010-10-11 NOTE — Discharge Summary (Signed)
NAMEJUSTIS, CLOSSER                 ACCOUNT NO.:  [id number]  MEDICAL RECORD NO.:  [id number]           PATIENT TYPE:  I  LOCATION:  5004                         FACILITY:  [facility]  PHYSICIAN:  Brendia Sacks, MD    DATE OF BIRTH:  1950-07-21  DATE OF ADMISSION:  09/28/2010 DATE OF DISCHARGE:  10/03/2010                        DISCHARGE SUMMARY - REFERRING   PRIMARY CARE PHYSICIAN:  Dr. Ronne Binning.  PRIMARY ORTHOPEDIC SURGEON:  Dr. Ave Filter.  PATIENT LEFT AGAINST MEDICAL ADVICE  DISCHARGE DIAGNOSES: 1. Status post fall. 2. Left hip fracture, status post repair. 3. Postoperative anemia, status post 2 units packed red blood cells. 4. Thrombocytopenia, resolved. 5. History of deep vein thrombosis. 6. Home Health referral made by Orthopedics.  HISTORY OF PRESENT ILLNESS:  A 60 year old man who fell at home and developed a left hip fracture.  The attending physician notes no evidence to suggest an neurologic process although the orthopedic surgeons note wonders if there may be have been a transient ischemic attack.  The patient sustained a left hip fracture and was admitted for treatment.  HOSPITAL COURSE: 1. Left femur fracture.  He went underwent operative nail fixation on     May 17.  He has been re-evaluated by Orthopedics and they     recommended touchdown weightbearing on the left with a walker and     follow up with Dr. Ave Filter in 2 weeks.  The patient may continue     Coumadin. 2. Postoperative anemia.  The patient was transfused 2 units packed     red blood cell with adequate response and repeat CBC today. 3. Thrombocytopenia.  This appears to have resolved.  Significance     unclear. 4. History of DVT.  Of note, the patient's INR is within normal limits     on admission.  He did admit to the admitting physician to be     noncompliant on warfarin.  He was seen by Pharmacy who advised him     to take his warfarin as directed.  He is also evaluated by Physical Therapy  and Occupational Therapy.  CONSULTATIONS:  Orthopedics.  PROCEDURES: 1. Nail fixation of the left proximal femur fracture on May 17. 2. Transfusion 2 units packed red blood cells.  IMAGING: 1. Chest x-ray on May 18:  Compatible with emphysema.  No acute     finding.  CT of the head on admission May 15:  No acute     intracranial abnormality.  Chronic bilateral ethmoid sinusitis.     Stable mild cerebral atrophy. 2. Chest x-ray on May 15:  Hyperinflation without acute abnormality. 3. Left hip film May 15:  Comminuted left intertrochanteric fracture     with overriding and mild varus deformity.  MICROBIOLOGY:  Blood culture, no growth to date.  LABORATORY STUDIES: 1. TSH was within normal limits. 2. CBC notable for a hemoglobin of 12.8 on admission, nadir was 7.3     and on discharge 10.7, status post 2 units packed red blood cells.     Platelet count back to normal 168. 3. INR 1.4 when the patient left. 4. Basic metabolic panel  on day the patient left was within normal     limits.  The patient demanded to be discharged home today.  I had spoken with the nurse and did relate through the nurse to the patient that I would need to evaluate him to consider him for discharge and would do so at the earliest possible time I could; however, the patient left against medical advice prior to my arrival.     Brendia Sacks, MD     DG/MEDQ  D:  10/03/2010  T:  10/03/2010  Job:  [id number]  cc:   Jones Broom, MD Lorelle Formosa, M.D.  Electronically Signed by Brendia Sacks  on 10/11/2010 04:36:02 PM

## 2010-10-12 NOTE — Op Note (Signed)
[name], [name]                 ACCOUNT NO.:  [id number]  MEDICAL RECORD NO.:  [id number]           PATIENT TYPE:  I  LOCATION:  5004                         FACILITY:  [facility]  PHYSICIAN:  Jones Broom, MD    DATE OF BIRTH:  [birth year]  DATE OF PROCEDURE:  09/30/2010 DATE OF DISCHARGE:                              OPERATIVE REPORT   PREOPERATIVE DIAGNOSIS:  Left four-part intertrochanteric proximal femur fracture.  POSTOPERATIVE DIAGNOSIS:  Left four-part intertrochanteric proximal femur fracture.  PROCEDURE PERFORMED:  Intramedullary nail fixation left four-part intertrochanteric proximal femur fracture.  ATTENDING SURGEON:  Jones Broom, MD  ASSISTANT:  None.  ANESTHESIA:  GETA.  COMPLICATIONS:  None.  DRAINS:  None.  SPECIMENS:  None.  ESTIMATED BLOOD LOSS:  100 mL.  INDICATIONS FOR SURGERY:  The patient is a 60 year old male who had a fall Sep 28, 2010, sustaining the above injury.  He is indicated for operative treatment to restore anatomy and promote early ambulation.  He understood the risks, benefits, and alternatives to the surgery including, but not limited to risk of bleeding, infection, damage to neurovascular structures, risks of anesthesia, and he elected to go forward with surgery.  OPERATIVE FINDINGS:  A DePuy Affixus trochanteric nail was used with near-anatomic restoration and appropriate fixation proximally and distally.  PROCEDURE:  The patient was identified in the preoperative holding area where I personally marked the operative site after verifying site, side, and procedure with the patient.  He was taken back to the operating room where general anesthesia was induced without complication.  He was transferred to the fracture table where the left lower extremity was placed in traction and the right lower extremity was placed in abduction and flexion.  All extremities were carefully padded in position.  The left lower extremity was  then prepped and draped in a standard sterile fashion after C-arm fluoroscopy was used to verify appropriate reduction of the fracture.  Time-out procedure was carried out.  The patient did receive IV antibiotics prior to incision.  Approximately 4-cm incision was made proximal to the tip of the greater trochanter and dissection was carried down through subcutaneous tissues and fascia to the level of the trochanteric tip.  Entry guidewire was placed under fluoroscopic guidance in AP and lateral planes and the entry reamer was then used to open the canal.  A long ball-tipped guidewire was then placed and measurement was taken to the appropriate size nail.  The 13 reamer was then used to bring the intramedullary canal for a size 11 nail.  The selected nail was then advanced over the guidewire and positioned on fluoroscopic imaging.  A proximal guide was used to make small incision in the skin and advance trocar to the lateral femur.  The guidewire was then advanced into the femoral neck and head.  Fluoroscopy was used to verify center-center placement.  Appropriate measurement was taken.  A new rotational pin was placed prior to reaming and screw placement.  The reaming was then carried out for the appropriate size screw and lag screw was advanced over the guidewire.  Excellent reduction was maintained and  position was verified in the AP and lateral planes.  The set screw was then advanced and backed off cord turned to allow sliding. The rotational pin was then removed and proximal jig was removed.  Final fluoroscopic imaging proximally demonstrated near anatomic alignment with appropriate position in the center-center position of the head.  Distally one interlocking screw was placed lateral to medial using perfect circle technique with fluoroscopy.  The wound was then copiously irrigated with normal saline and closed with #1 Vicryl in the deep fascial layer, 2-0 Vicryl in the skin, and  staples for skin closure. Sterile dressings were then applied including Mepilex dressings.  The patient was then allowed to awaken from general anesthesia, transferred to the stretcher, and taken to the recovery room in stable condition.  POSTOPERATIVE PLAN:  He will be weightbearing as tolerated.  He will return to the [employer]  He will have 2 weeks postoperative anticoagulation for DVT prophylaxis.     Jones Broom, MD     JC/MEDQ  D:  09/30/2010  T:  10/01/2010  Job:  [id number]  Electronically Signed by Jones Broom  on 10/12/2010 01:12:11 PM

## 2010-10-12 NOTE — Consult Note (Signed)
[name], [name]                 ACCOUNT NO.:  [id number]  MEDICAL RECORD NO.:  [id number]           PATIENT TYPE:  I  LOCATION:  5004                         FACILITY:  [facility]  PHYSICIAN:  Jones Broom, MD    DATE OF BIRTH:  04/10/1951  DATE OF CONSULTATION:  09/28/2010 DATE OF DISCHARGE:                                CONSULTATION   CONSULTING PHYSICIAN:  Dr. Freida Busman.  REASON FOR CONSULTATION:  Left hip fracture.  HISTORY OF PRESENT ILLNESS:  [name] is a pleasant 60 year old gentleman with a history of prior stroke in 2005 which affected the left side.  He tells me that earlier this afternoon, he had a sudden feeling of weakness on the left side which caused a fall landing directly on his left hip.  He had a main complaint of left hip pain and was taken to [facility] Emergency Department.  He denies passing out with a fall, just states that he suddenly got weak.  He has been prescribed Coumadin which he is supposed to take on a regular basis, but admits that he frequently misses his dose.  He denies any other injury with a fall.  PAST MEDICAL HISTORY:  As above.  MEDICATIONS:  Coumadin, tramadol and Topamax which he takes for headaches.  SURGICAL HISTORY:  None.  SOCIAL HISTORY:  He drinks a pint of brandy every week, smokes about a pack cigarettes per week.  He is not working.  He lives alone.  REVIEW OF SYSTEMS:  As above in addition to headaches.  No numbness and tingling currently in lower extremities.  DRUG ALLERGIES:  None.  PHYSICAL EXAMINATION:  VITAL SIGNS:  Blood pressure 104/64, pulse 105, respiratory rate 15, temperature 98.3 orally. GENERAL:  He is lying on hospital gurney in no acute distress.  He is awake, alert and oriented x3. EXTREMITIES:  Examination of the left lower extremity demonstrates to be short and externally rotated.  He has slight tenderness about the hip and pain with any attempted range of motion in the left lower extremity. No  significant tenderness about the knee or ankle.  He has palpable DP and PT pulses and can dorsiflex and plantar flex his toes and ankle.  No other tenderness or pain with range of motion of upper extremities or right lower extremity.  DIAGNOSTIC STUDIES:  X-ray of the AP pelvis and left hip demonstrated comminuted intertrochanteric left hip fracture with shortening. Hematocrit 35.8, INR 0.98.  IMPRESSION AND PLAN:  A 60 year old male with history of stroke and recent symptoms concerning for transient ischemic attack with a fall and left comminuted intertrochanteric hip fracture.  PLAN:  He will be admitted to Hospitalist Medicine Service tonight for further evaluation of his medical causes for fall.  He will need open reduction and internal fixation of his left hip when he is stable, but it is certainly not emergent.  In the meantime, he will be placed on Buck traction to keep him comfortable and will have pain medication. Coumadin should be held, although his current INR is normal.  Repeat x- rays of the hip in [place] traction and will  also obtain x-rays of the entire left femur.  Plan will be for surgeries within the next 1-2 days when he is medically stable.     Jones Broom, MD     JC/MEDQ  D:  09/28/2010  T:  09/29/2010  Job:  [id number]  Electronically Signed by Jones Broom  on 10/12/2010 01:12:02 PM

## 2012-07-04 IMAGING — CT CT HEAD W/O CM
1 of 2 series · 13 of 30 positions shown, 17 images · non-contrast
Comparison: 04/14/2009

CLINICAL DATA: Fall.  Dizziness and nausea.

CT HEAD WITHOUT CONTRAST
TECHNIQUE: Contiguous axial images were obtained from the base of
the skull through the vertex without contrast.

[Series 2: head routine 4.8 h37s · axial · 0.45mm/px · z∈[-105,+40]mm · 13 of 36 slices shown, 17 images]
[im 3/36  brain]
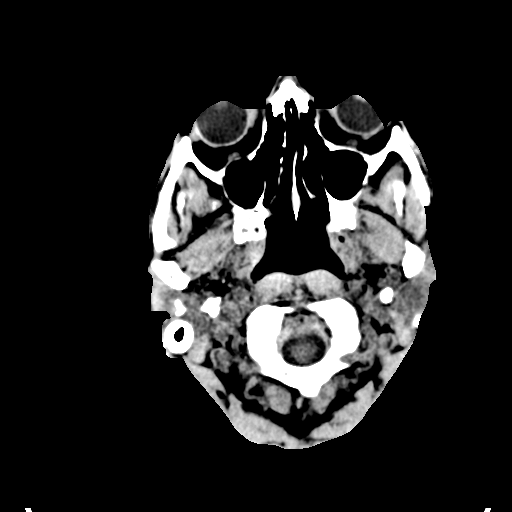
[im 3/36  bone]
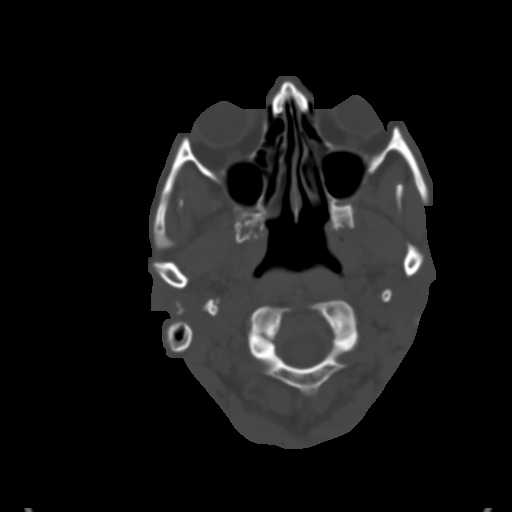
[im 6/36  brain]
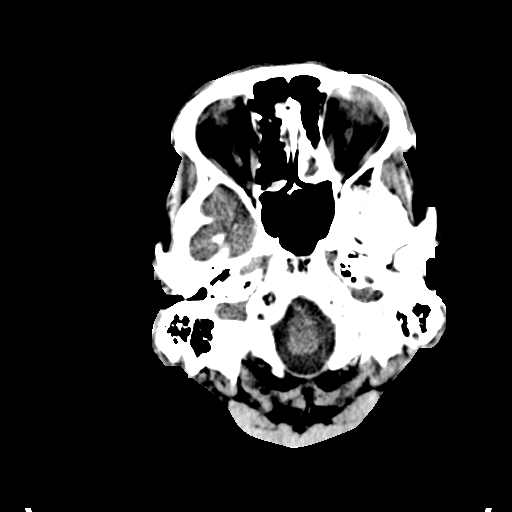
[im 8/36  brain]
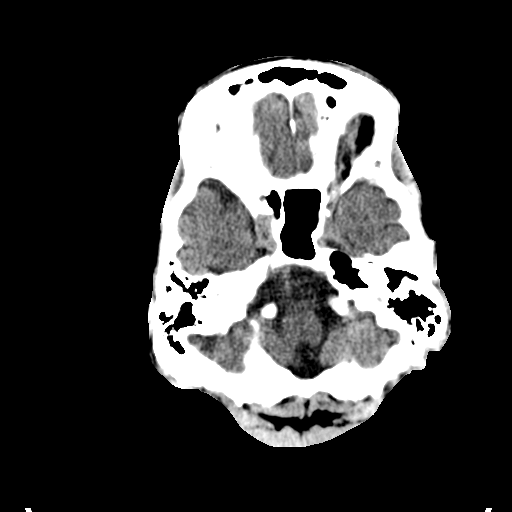
[im 11/36  brain]
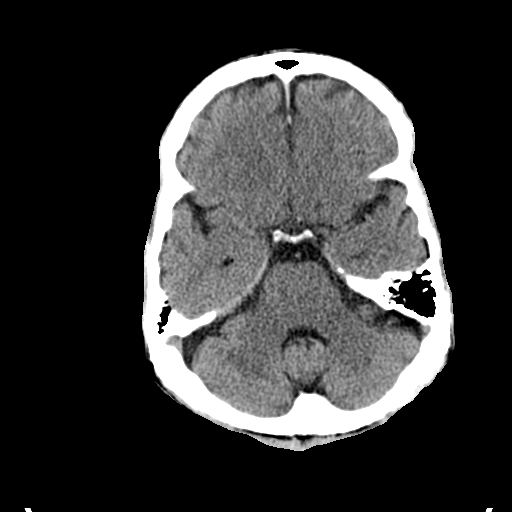
[im 13/36  brain]
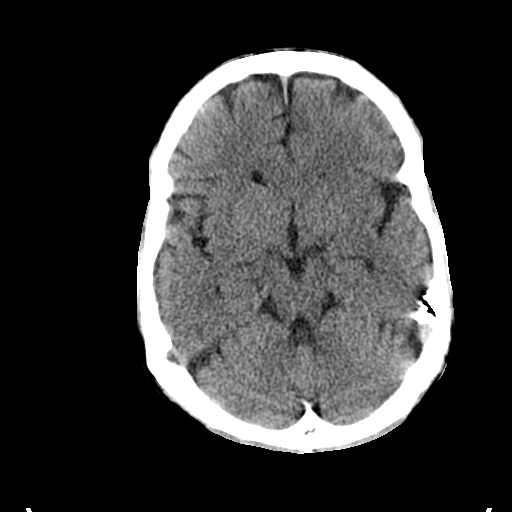
[im 13/36  bone]
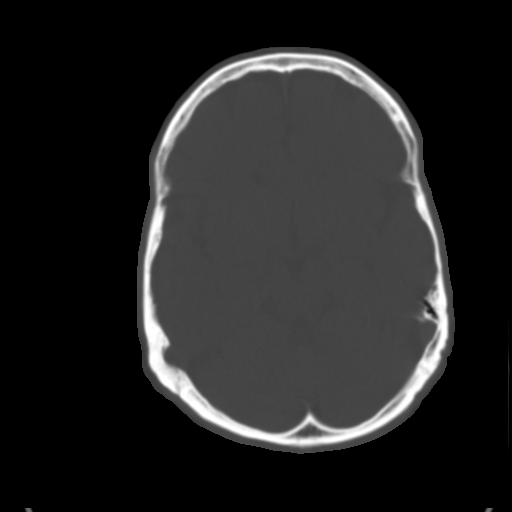
[im 16/36  brain]
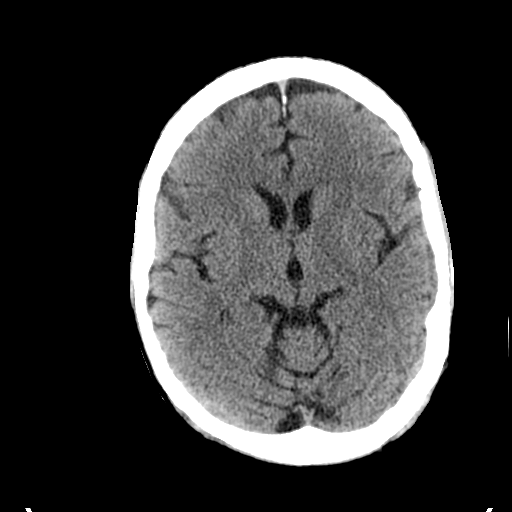
[im 18/36  brain]
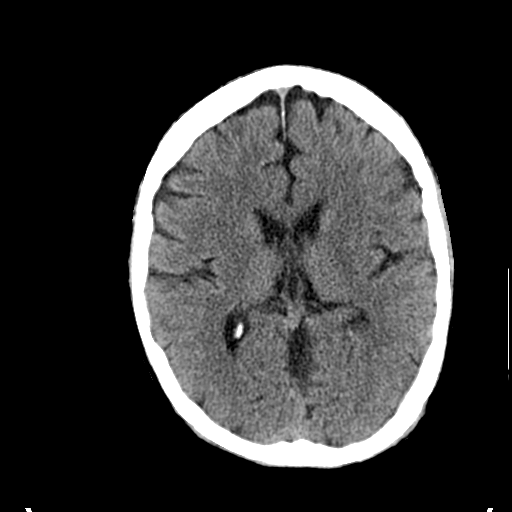
[im 21/36  brain]
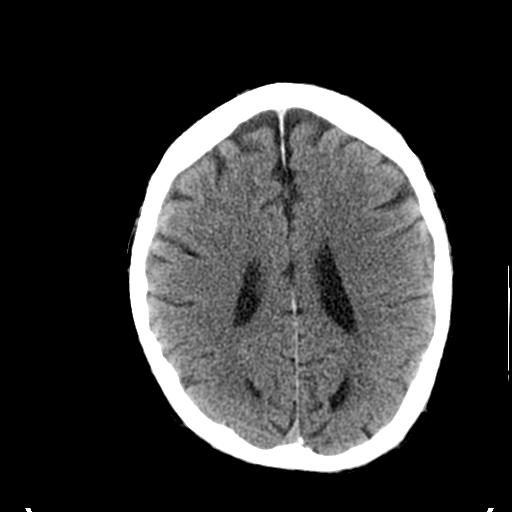
[im 23/36  brain]
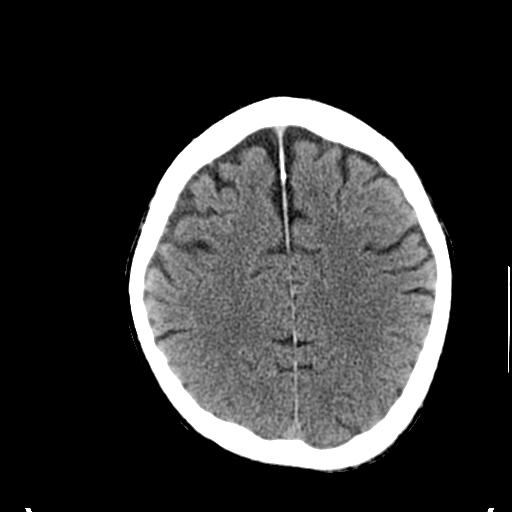
[im 23/36  bone]
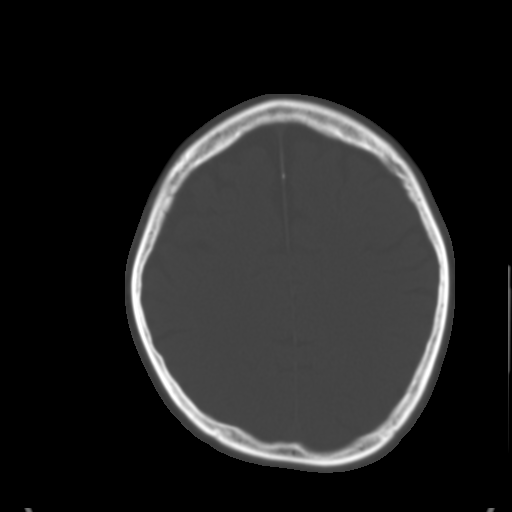
[im 26/36  brain]
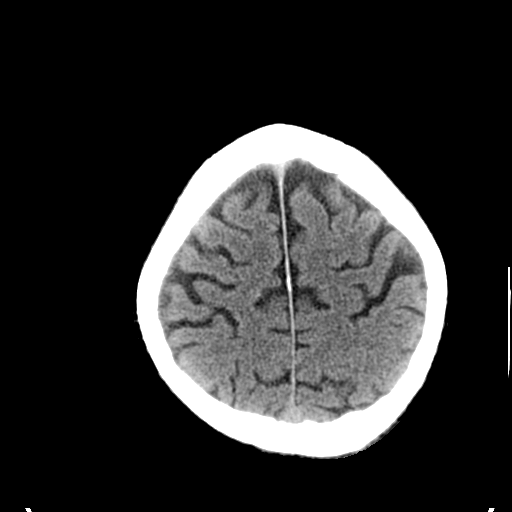
[im 28/36  brain]
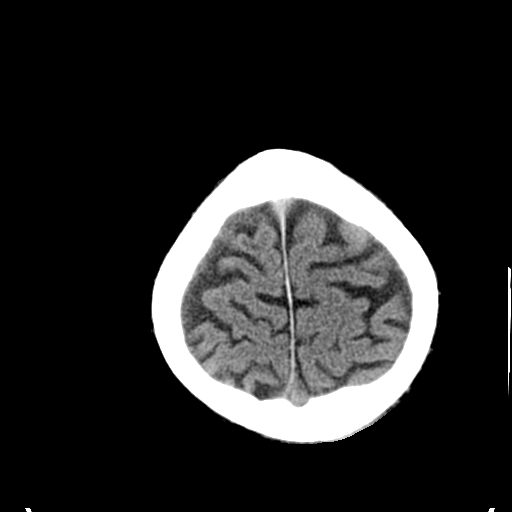
[im 31/36  brain]
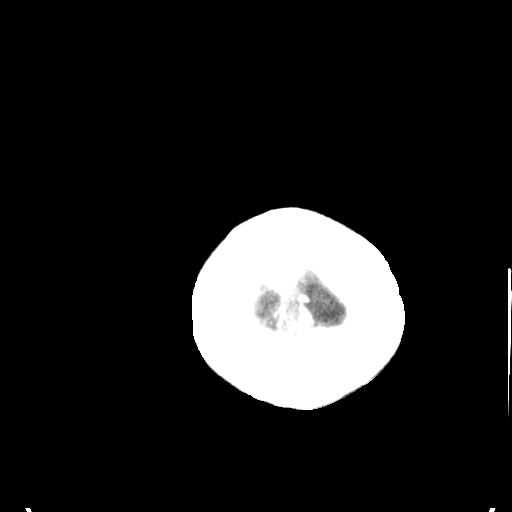
[im 33/36  brain]
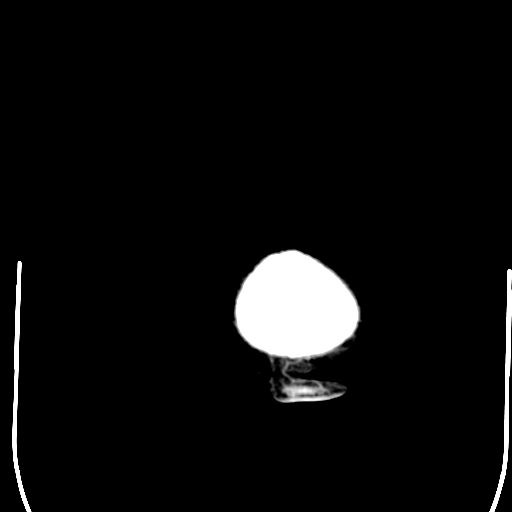
[im 33/36  bone]
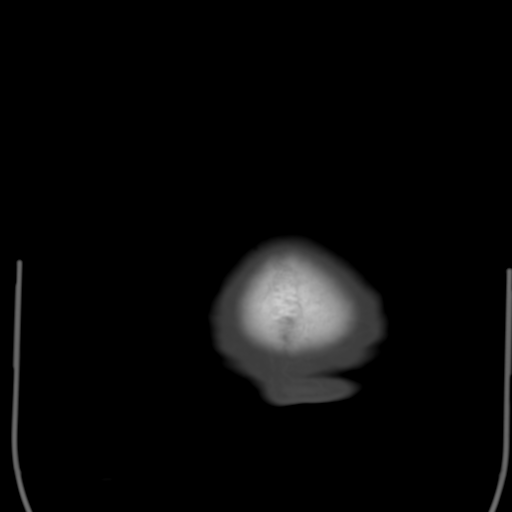

[13 of 30 positions shown; findings below may reference images not displayed]

FINDINGS: There is no evidence of intracranial hemorrhage, brain
edema or other signs of acute infarction.  There is no evidence of
intracranial mass lesion or mass effect.  No abnormal extra-axial
fluid collections are identified.

Mild diffuse cerebral atrophy is stable.  No evidence of
hydrocephalus or other intracranial abnormality.  No evidence of
skull fracture.  Opacification of several posterior ethmoid air
cells is seen bilaterally, consistent chronic sinusitis.
IMPRESSION: 1.  No acute intracranial abnormality.
2.  Stable mild cerebral atrophy.
3.  Chronic bilateral ethmoid sinusitis.

## 2012-07-04 IMAGING — CR DG CHEST 2V
1 series · 1 of 1 positions shown · non-contrast
Comparison: 04/14/2009

CLINICAL DATA: Fell

CHEST - 2 VIEW

[w chest lat]
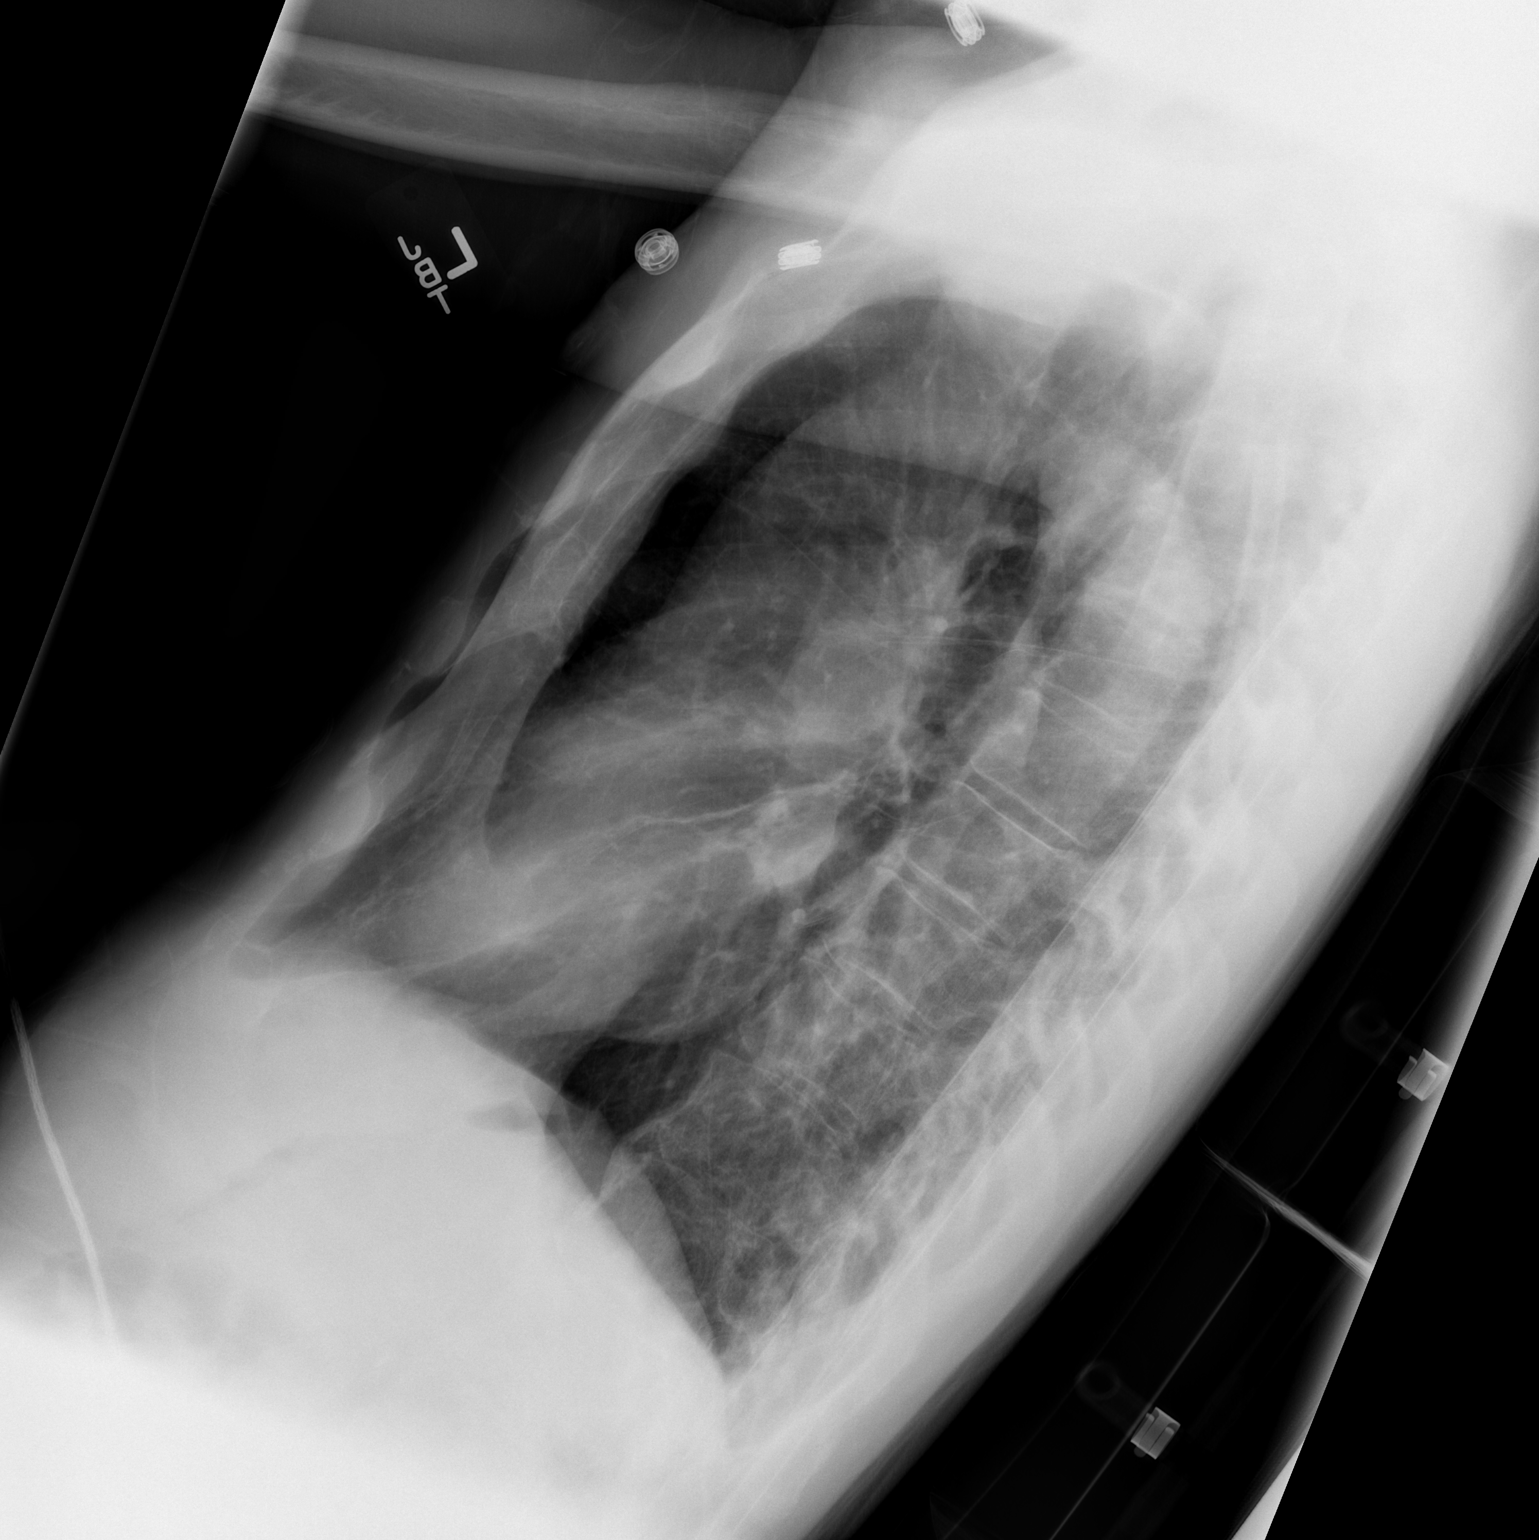

[1 of 1 positions shown; findings below may reference images not displayed]

FINDINGS: Mild hyperinflation as before.  Lungs clear.  Heart size
normal.  No effusion or pneumothorax.  Regional bones unremarkable.
IMPRESSION: Hyperinflation without acute or superimposed abnormality.

## 2013-09-29 DIAGNOSIS — S72143A Displaced intertrochanteric fracture of unspecified femur, initial encounter for closed fracture: Principal | ICD-10-CM | POA: Diagnosis present

## 2013-09-29 DIAGNOSIS — E43 Unspecified severe protein-calorie malnutrition: Secondary | ICD-10-CM | POA: Diagnosis present

## 2013-09-29 DIAGNOSIS — D62 Acute posthemorrhagic anemia: Secondary | ICD-10-CM | POA: Diagnosis not present

## 2013-09-29 DIAGNOSIS — D45 Polycythemia vera: Secondary | ICD-10-CM | POA: Diagnosis present

## 2013-09-29 DIAGNOSIS — Z8673 Personal history of transient ischemic attack (TIA), and cerebral infarction without residual deficits: Secondary | ICD-10-CM

## 2013-09-29 DIAGNOSIS — F17200 Nicotine dependence, unspecified, uncomplicated: Secondary | ICD-10-CM | POA: Diagnosis present

## 2013-09-29 DIAGNOSIS — Z86718 Personal history of other venous thrombosis and embolism: Secondary | ICD-10-CM

## 2013-09-29 DIAGNOSIS — S7223XA Displaced subtrochanteric fracture of unspecified femur, initial encounter for closed fracture: Secondary | ICD-10-CM | POA: Diagnosis present

## 2013-09-29 DIAGNOSIS — Y92009 Unspecified place in unspecified non-institutional (private) residence as the place of occurrence of the external cause: Secondary | ICD-10-CM

## 2013-09-29 DIAGNOSIS — R7309 Other abnormal glucose: Secondary | ICD-10-CM | POA: Diagnosis present

## 2013-09-29 DIAGNOSIS — Z9119 Patient's noncompliance with other medical treatment and regimen: Secondary | ICD-10-CM

## 2013-09-29 DIAGNOSIS — Z681 Body mass index (BMI) 19 or less, adult: Secondary | ICD-10-CM

## 2013-09-29 DIAGNOSIS — Z91199 Patient's noncompliance with other medical treatment and regimen due to unspecified reason: Secondary | ICD-10-CM

## 2013-09-29 DIAGNOSIS — Z96649 Presence of unspecified artificial hip joint: Secondary | ICD-10-CM

## 2013-09-29 DIAGNOSIS — W010XXA Fall on same level from slipping, tripping and stumbling without subsequent striking against object, initial encounter: Secondary | ICD-10-CM | POA: Diagnosis present

## 2013-09-30 ENCOUNTER — Encounter (HOSPITAL_COMMUNITY): Payer: Self-pay | Admitting: Emergency Medicine

## 2013-09-30 ENCOUNTER — Emergency Department (HOSPITAL_COMMUNITY): Payer: Medicare Other

## 2013-09-30 ENCOUNTER — Encounter (HOSPITAL_COMMUNITY): Payer: Medicare Other | Admitting: Anesthesiology

## 2013-09-30 ENCOUNTER — Inpatient Hospital Stay (HOSPITAL_COMMUNITY): Payer: Medicare Other

## 2013-09-30 ENCOUNTER — Inpatient Hospital Stay (HOSPITAL_COMMUNITY)
Admission: EM | Admit: 2013-09-30 | Discharge: 2013-10-02 | DRG: 480 | Disposition: A | Discharge status: Home Health | Payer: Medicare Other | Attending: Internal Medicine | Admitting: Internal Medicine

## 2013-09-30 ENCOUNTER — Inpatient Hospital Stay (HOSPITAL_COMMUNITY): Payer: Medicare Other | Admitting: Anesthesiology

## 2013-09-30 ENCOUNTER — Encounter (HOSPITAL_COMMUNITY)
Admission: EM | Disposition: A | Discharge status: Home Health | Payer: Self-pay | Source: Home / Self Care | Attending: Internal Medicine

## 2013-09-30 DIAGNOSIS — R739 Hyperglycemia, unspecified: Secondary | ICD-10-CM | POA: Diagnosis not present

## 2013-09-30 DIAGNOSIS — S72009A Fracture of unspecified part of neck of unspecified femur, initial encounter for closed fracture: Secondary | ICD-10-CM

## 2013-09-30 DIAGNOSIS — Z8673 Personal history of transient ischemic attack (TIA), and cerebral infarction without residual deficits: Secondary | ICD-10-CM

## 2013-09-30 DIAGNOSIS — Z9119 Patient's noncompliance with other medical treatment and regimen: Secondary | ICD-10-CM

## 2013-09-30 DIAGNOSIS — Z720 Tobacco use: Secondary | ICD-10-CM | POA: Diagnosis present

## 2013-09-30 DIAGNOSIS — S72001A Fracture of unspecified part of neck of right femur, initial encounter for closed fracture: Secondary | ICD-10-CM | POA: Diagnosis present

## 2013-09-30 DIAGNOSIS — Z86718 Personal history of other venous thrombosis and embolism: Secondary | ICD-10-CM

## 2013-09-30 DIAGNOSIS — Z91199 Patient's noncompliance with other medical treatment and regimen due to unspecified reason: Secondary | ICD-10-CM

## 2013-09-30 DIAGNOSIS — D62 Acute posthemorrhagic anemia: Secondary | ICD-10-CM | POA: Diagnosis not present

## 2013-09-30 DIAGNOSIS — F17200 Nicotine dependence, unspecified, uncomplicated: Secondary | ICD-10-CM

## 2013-09-30 DIAGNOSIS — E43 Unspecified severe protein-calorie malnutrition: Secondary | ICD-10-CM | POA: Diagnosis present

## 2013-09-30 HISTORY — DX: Cerebral infarction, unspecified: I63.9

## 2013-09-30 HISTORY — PX: FEMUR IM NAIL: SHX1597

## 2013-09-30 LAB — GLUCOSE, CAPILLARY
GLUCOSE-CAPILLARY: 100 mg/dL — AB (ref 70–99)
GLUCOSE-CAPILLARY: 81 mg/dL (ref 70–99)
GLUCOSE-CAPILLARY: 107 mg/dL — AB (ref 70–99)
Glucose-Capillary: 256 mg/dL — ABNORMAL HIGH (ref 70–99)

## 2013-09-30 LAB — COMPREHENSIVE METABOLIC PANEL
ALK PHOS: 72 U/L (ref 39–117)
ALT: 21 U/L (ref 0–53)
AST: 35 U/L (ref 0–37)
Albumin: 3.5 g/dL (ref 3.5–5.2)
BUN: 9 mg/dL (ref 6–23)
CO2: 25 mEq/L (ref 19–32)
Calcium: 8.4 mg/dL (ref 8.4–10.5)
Chloride: 105 mEq/L (ref 96–112)
Creatinine, Ser: 0.8 mg/dL (ref 0.50–1.35)
GFR calc non Af Amer: >90 mL/min (ref >90)
Glucose, Bld: 103 mg/dL — ABNORMAL HIGH (ref 70–99)
Potassium: 4 mEq/L (ref 3.7–5.3)
SODIUM: 144 meq/L (ref 137–147)
TOTAL PROTEIN: 6.5 g/dL (ref 6.0–8.3)
Total Bilirubin: 0.5 mg/dL (ref 0.3–1.2)

## 2013-09-30 LAB — I-STAT CHEM 8, ED
BUN: 7 mg/dL (ref 6–23)
CALCIUM ION: 1.11 mmol/L — AB (ref 1.13–1.30)
Chloride: 104 mEq/L (ref 96–112)
Creatinine, Ser: 1.1 mg/dL (ref 0.50–1.35)
GLUCOSE: 108 mg/dL — AB (ref 70–99)
HCT: 40 % (ref 39.0–52.0)
HEMOGLOBIN: 13.6 g/dL (ref 13.0–17.0)
Potassium: 3.4 mEq/L — ABNORMAL LOW (ref 3.7–5.3)
Sodium: 142 mEq/L (ref 137–147)
TCO2: 25 mmol/L (ref 0–100)

## 2013-09-30 LAB — CBC WITH DIFFERENTIAL/PLATELET
BASOS ABS: 0 10*3/uL (ref 0.0–0.1)
BASOS PCT: 0 % (ref 0–1)
Basophils Absolute: 0 10*3/uL (ref 0.0–0.1)
Basophils Relative: 0 % (ref 0–1)
EOS ABS: 0 10*3/uL (ref 0.0–0.7)
EOS ABS: 0 10*3/uL (ref 0.0–0.7)
EOS PCT: 0 % (ref 0–5)
EOS PCT: 0 % (ref 0–5)
HEMATOCRIT: 36.6 % — AB (ref 39.0–52.0)
HEMATOCRIT: 32 % — AB (ref 39.0–52.0)
HEMOGLOBIN: 13 g/dL (ref 13.0–17.0)
HEMOGLOBIN: 11.2 g/dL — AB (ref 13.0–17.0)
LYMPHS ABS: 1.6 10*3/uL (ref 0.7–4.0)
Lymphocytes Relative: 22 % (ref 12–46)
Lymphocytes Relative: 26 % (ref 12–46)
Lymphs Abs: 1.7 10*3/uL (ref 0.7–4.0)
MCH: 36.4 pg — AB (ref 26.0–34.0)
MCH: 35.9 pg — AB (ref 26.0–34.0)
MCHC: 35.5 g/dL (ref 30.0–36.0)
MCHC: 35 g/dL (ref 30.0–36.0)
MCV: 102.5 fL — AB (ref 78.0–100.0)
MCV: 102.6 fL — AB (ref 78.0–100.0)
MONO ABS: 0.6 10*3/uL (ref 0.1–1.0)
MONOS PCT: 8 % (ref 3–12)
MONOS PCT: 9 % (ref 3–12)
Monocytes Absolute: 0.6 10*3/uL (ref 0.1–1.0)
NEUTROS ABS: 4.2 10*3/uL (ref 1.7–7.7)
NEUTROS PCT: 70 % (ref 43–77)
Neutro Abs: 5.2 10*3/uL (ref 1.7–7.7)
Neutrophils Relative %: 65 % (ref 43–77)
Platelets: 184 10*3/uL (ref 150–400)
Platelets: 156 10*3/uL (ref 150–400)
RBC: 3.57 MIL/uL — AB (ref 4.22–5.81)
RBC: 3.12 MIL/uL — ABNORMAL LOW (ref 4.22–5.81)
RDW: 13.6 % (ref 11.5–15.5)
RDW: 13.7 % (ref 11.5–15.5)
WBC: 7.5 10*3/uL (ref 4.0–10.5)
WBC: 6.4 10*3/uL (ref 4.0–10.5)

## 2013-09-30 LAB — PROTIME-INR
INR: 0.92 (ref 0.00–1.49)
Prothrombin Time: 12.2 seconds (ref 11.6–15.2)

## 2013-09-30 LAB — TYPE AND SCREEN
ABO/RH(D): A POS
Antibody Screen: NEGATIVE

## 2013-09-30 LAB — SURGICAL PCR SCREEN
MRSA, PCR: NEGATIVE
Staphylococcus aureus: NEGATIVE

## 2013-09-30 LAB — TSH: TSH: 0.919 u[IU]/mL (ref 0.350–4.500)

## 2013-09-30 LAB — VITAMIN D 25 HYDROXY (VIT D DEFICIENCY, FRACTURES): Vit D, 25-Hydroxy: 16 ng/mL — ABNORMAL LOW (ref 30–89)

## 2013-09-30 LAB — ABO/RH: ABO/RH(D): A POS

## 2013-09-30 SURGERY — INSERTION, INTRAMEDULLARY ROD, FEMUR
Anesthesia: General | Site: Hip | Laterality: Right

## 2013-09-30 MED ORDER — MENTHOL 3 MG MT LOZG
1.0000 | LOZENGE | OROMUCOSAL | Status: DC | PRN
  Filled 2013-09-30: qty 9

## 2013-09-30 MED ORDER — BISACODYL 5 MG PO TBEC: 5.0000 mg | DELAYED_RELEASE_TABLET | Freq: Every day | ORAL | Status: DC | PRN

## 2013-09-30 MED ORDER — NEOSTIGMINE METHYLSULFATE 10 MG/10ML IV SOLN
INTRAVENOUS | Status: DC | PRN
  Administered 2013-09-30: 3 mg via INTRAVENOUS

## 2013-09-30 MED ORDER — WARFARIN - PHARMACIST DOSING INPATIENT: Freq: Every day | Status: DC

## 2013-09-30 MED ORDER — METHOCARBAMOL 500 MG PO TABS: 500.0000 mg | ORAL_TABLET | Freq: Four times a day (QID) | ORAL | Status: DC | PRN

## 2013-09-30 MED ORDER — ROCURONIUM BROMIDE 100 MG/10ML IV SOLN
INTRAVENOUS | Status: DC | PRN
  Administered 2013-09-30: 20 mg via INTRAVENOUS

## 2013-09-30 MED ORDER — METHOCARBAMOL 500 MG PO TABS: 500.0000 mg | ORAL_TABLET | Freq: Two times a day (BID) | ORAL | Status: DC

## 2013-09-30 MED ORDER — LIDOCAINE HCL (CARDIAC) 20 MG/ML IV SOLN
INTRAVENOUS | Status: DC | PRN
  Administered 2013-09-30: 100 mg via INTRAVENOUS

## 2013-09-30 MED ORDER — CEFAZOLIN SODIUM-DEXTROSE 2-3 GM-% IV SOLR
INTRAVENOUS | Status: AC
  Filled 2013-09-30: qty 50

## 2013-09-30 MED ORDER — METOCLOPRAMIDE HCL 5 MG/ML IJ SOLN
5.0000 mg | Freq: Three times a day (TID) | INTRAMUSCULAR | Status: DC | PRN
  Filled 2013-09-30: qty 2

## 2013-09-30 MED ORDER — PHENYLEPHRINE HCL 10 MG/ML IJ SOLN
INTRAMUSCULAR | Status: DC | PRN
  Administered 2013-09-30: 80 ug via INTRAVENOUS
  Administered 2013-09-30: 120 ug via INTRAVENOUS
  Administered 2013-09-30 (×2): 80 ug via INTRAVENOUS

## 2013-09-30 MED ORDER — SENNOSIDES-DOCUSATE SODIUM 8.6-50 MG PO TABS: 1.0000 | ORAL_TABLET | Freq: Every evening | ORAL | Status: DC | PRN

## 2013-09-30 MED ORDER — WARFARIN SODIUM 7.5 MG PO TABS
7.5000 mg | ORAL_TABLET | Freq: Once | ORAL | Status: AC
  Administered 2013-09-30: 7.5 mg via ORAL
  Filled 2013-09-30: qty 1

## 2013-09-30 MED ORDER — SODIUM CHLORIDE 0.9 % IV SOLN
Freq: Once | INTRAVENOUS | Status: AC
  Administered 2013-09-30: 02:00:00 via INTRAVENOUS

## 2013-09-30 MED ORDER — ACETAMINOPHEN 650 MG RE SUPP: 650.0000 mg | Freq: Four times a day (QID) | RECTAL | Status: DC | PRN

## 2013-09-30 MED ORDER — CEFAZOLIN SODIUM-DEXTROSE 2-3 GM-% IV SOLR
2.0000 g | INTRAVENOUS | Status: AC
  Administered 2013-09-30: 2 g via INTRAVENOUS
  Filled 2013-09-30: qty 50

## 2013-09-30 MED ORDER — LACTATED RINGERS IV SOLN: INTRAVENOUS | Status: DC

## 2013-09-30 MED ORDER — GLYCOPYRROLATE 0.2 MG/ML IJ SOLN
INTRAMUSCULAR | Status: DC | PRN
  Administered 2013-09-30: 0.4 mg via INTRAVENOUS

## 2013-09-30 MED ORDER — DEXAMETHASONE SODIUM PHOSPHATE 10 MG/ML IJ SOLN
INTRAMUSCULAR | Status: AC
  Filled 2013-09-30: qty 1

## 2013-09-30 MED ORDER — PROPOFOL 10 MG/ML IV BOLUS
INTRAVENOUS | Status: DC | PRN
  Administered 2013-09-30: 160 mg via INTRAVENOUS

## 2013-09-30 MED ORDER — MORPHINE SULFATE 2 MG/ML IJ SOLN
0.5000 mg | INTRAMUSCULAR | Status: DC | PRN
  Administered 2013-09-30 – 2013-10-01 (×4): 0.5 mg via INTRAVENOUS
  Filled 2013-09-30 (×4): qty 1

## 2013-09-30 MED ORDER — DEXAMETHASONE SODIUM PHOSPHATE 10 MG/ML IJ SOLN
INTRAMUSCULAR | Status: DC | PRN
  Administered 2013-09-30: 10 mg via INTRAVENOUS

## 2013-09-30 MED ORDER — PHENOL 1.4 % MT LIQD
1.0000 | OROMUCOSAL | Status: DC | PRN
  Filled 2013-09-30: qty 177

## 2013-09-30 MED ORDER — PROMETHAZINE HCL 25 MG/ML IJ SOLN: 6.2500 mg | INTRAMUSCULAR | Status: DC | PRN

## 2013-09-30 MED ORDER — CEFAZOLIN SODIUM-DEXTROSE 2-3 GM-% IV SOLR
INTRAVENOUS | Status: DC | PRN
  Administered 2013-09-30 (×2): 2 g via INTRAVENOUS

## 2013-09-30 MED ORDER — LIDOCAINE HCL (CARDIAC) 20 MG/ML IV SOLN
INTRAVENOUS | Status: AC
  Filled 2013-09-30: qty 5

## 2013-09-30 MED ORDER — FENTANYL CITRATE 0.05 MG/ML IJ SOLN
INTRAMUSCULAR | Status: DC | PRN
  Administered 2013-09-30: 25 ug via INTRAVENOUS

## 2013-09-30 MED ORDER — SUCCINYLCHOLINE CHLORIDE 20 MG/ML IJ SOLN
INTRAMUSCULAR | Status: DC | PRN
  Administered 2013-09-30: 100 mg via INTRAVENOUS

## 2013-09-30 MED ORDER — ONDANSETRON HCL 4 MG/2ML IJ SOLN
INTRAMUSCULAR | Status: DC | PRN
  Administered 2013-09-30: 4 mg via INTRAVENOUS

## 2013-09-30 MED ORDER — SODIUM CHLORIDE 0.9 % IV SOLN
INTRAVENOUS | Status: AC
  Administered 2013-09-30: 05:00:00 via INTRAVENOUS

## 2013-09-30 MED ORDER — FENTANYL CITRATE 0.05 MG/ML IJ SOLN
INTRAMUSCULAR | Status: AC
  Filled 2013-09-30: qty 2

## 2013-09-30 MED ORDER — ONDANSETRON HCL 4 MG/2ML IJ SOLN
INTRAMUSCULAR | Status: AC
  Filled 2013-09-30: qty 2

## 2013-09-30 MED ORDER — LACTATED RINGERS IV SOLN
INTRAVENOUS | Status: DC
Start: 2013-09-30 — End: 2013-09-30
  Administered 2013-09-30: 1000 mL via INTRAVENOUS
  Administered 2013-09-30: 17:00:00 via INTRAVENOUS

## 2013-09-30 MED ORDER — METHOCARBAMOL 1000 MG/10ML IJ SOLN
500.0000 mg | Freq: Four times a day (QID) | INTRAVENOUS | Status: DC | PRN
  Filled 2013-09-30: qty 5

## 2013-09-30 MED ORDER — GLYCOPYRROLATE 0.2 MG/ML IJ SOLN
INTRAMUSCULAR | Status: AC
  Filled 2013-09-30: qty 2

## 2013-09-30 MED ORDER — PROPOFOL 10 MG/ML IV BOLUS
INTRAVENOUS | Status: AC
  Filled 2013-09-30: qty 20

## 2013-09-30 MED ORDER — METOCLOPRAMIDE HCL 10 MG PO TABS: 5.0000 mg | ORAL_TABLET | Freq: Three times a day (TID) | ORAL | Status: DC | PRN

## 2013-09-30 MED ORDER — HYDROMORPHONE HCL PF 1 MG/ML IJ SOLN
0.5000 mg | Freq: Once | INTRAMUSCULAR | Status: AC
  Administered 2013-09-30: 0.5 mg via INTRAVENOUS
  Filled 2013-09-30: qty 1

## 2013-09-30 MED ORDER — OXYCODONE-ACETAMINOPHEN 5-325 MG PO TABS
1.0000 | ORAL_TABLET | Freq: Once | ORAL | Status: AC
  Administered 2013-09-30: 1 via ORAL
  Filled 2013-09-30: qty 1

## 2013-09-30 MED ORDER — MORPHINE SULFATE 2 MG/ML IJ SOLN
0.5000 mg | INTRAMUSCULAR | Status: DC | PRN
  Administered 2013-09-30 (×2): 0.5 mg via INTRAVENOUS
  Filled 2013-09-30 (×2): qty 1

## 2013-09-30 MED ORDER — SODIUM CHLORIDE 0.9 % IV SOLN
10.0000 mg | INTRAVENOUS | Status: DC | PRN
  Administered 2013-09-30: 30 ug/min via INTRAVENOUS

## 2013-09-30 MED ORDER — KCL IN DEXTROSE-NACL 20-5-0.45 MEQ/L-%-% IV SOLN
INTRAVENOUS | Status: DC
  Administered 2013-09-30: 20:00:00 via INTRAVENOUS
  Filled 2013-09-30 (×3): qty 1000

## 2013-09-30 MED ORDER — ACETAMINOPHEN 325 MG PO TABS: 650.0000 mg | ORAL_TABLET | Freq: Four times a day (QID) | ORAL | Status: DC | PRN

## 2013-09-30 MED ORDER — ONDANSETRON HCL 4 MG/2ML IJ SOLN: 4.0000 mg | Freq: Four times a day (QID) | INTRAMUSCULAR | Status: DC | PRN

## 2013-09-30 MED ORDER — 0.9 % SODIUM CHLORIDE (POUR BTL) OPTIME
TOPICAL | Status: DC | PRN
  Administered 2013-09-30: 1000 mL

## 2013-09-30 MED ORDER — ONDANSETRON HCL 4 MG PO TABS: 4.0000 mg | ORAL_TABLET | Freq: Four times a day (QID) | ORAL | Status: DC | PRN

## 2013-09-30 MED ORDER — OXYCODONE-ACETAMINOPHEN 5-325 MG PO TABS: 1.0000 | ORAL_TABLET | ORAL | Status: DC | PRN

## 2013-09-30 MED ORDER — HYDROCODONE-ACETAMINOPHEN 5-325 MG PO TABS
1.0000 | ORAL_TABLET | Freq: Four times a day (QID) | ORAL | Status: DC | PRN
  Administered 2013-09-30 – 2013-10-01 (×5): 2 via ORAL
  Filled 2013-09-30 (×5): qty 2

## 2013-09-30 MED ORDER — FENTANYL CITRATE 0.05 MG/ML IJ SOLN
25.0000 ug | INTRAMUSCULAR | Status: DC | PRN
  Administered 2013-09-30: 25 ug via INTRAVENOUS

## 2013-09-30 MED ORDER — MEPERIDINE HCL 50 MG/ML IJ SOLN: 6.2500 mg | INTRAMUSCULAR | Status: DC | PRN

## 2013-09-30 MED ORDER — DOCUSATE SODIUM 100 MG PO CAPS
100.0000 mg | ORAL_CAPSULE | Freq: Two times a day (BID) | ORAL | Status: DC
  Administered 2013-09-30 – 2013-10-02 (×4): 100 mg via ORAL
  Filled 2013-09-30 (×5): qty 1

## 2013-09-30 MED ORDER — ROCURONIUM BROMIDE 100 MG/10ML IV SOLN
INTRAVENOUS | Status: AC
  Filled 2013-09-30: qty 1

## 2013-09-30 SURGICAL SUPPLY — 61 items
BAG SPEC THK2 15X12 ZIP CLS (MISCELLANEOUS)
BAG ZIPLOCK 12X15 (MISCELLANEOUS) ×1 IMPLANT
BIT DRILL 4.3MMS DISTAL GRDTED (BIT) IMPLANT
BNDG COHESIVE 6X5 TAN STRL LF (GAUZE/BANDAGES/DRESSINGS) ×1 IMPLANT
CORTICAL BONE SCR 5.0MM X 48MM (Screw) ×3 IMPLANT
DRAPE C-ARM 42X120 X-RAY (DRAPES) ×3 IMPLANT
DRAPE C-ARMOR (DRAPES) ×2 IMPLANT
DRAPE INCISE IOBAN 66X45 STRL (DRAPES) ×3 IMPLANT
DRAPE LG THREE QUARTER DISP (DRAPES) ×1 IMPLANT
DRAPE ORTHO SPLIT 77X108 STRL (DRAPES) ×6
DRAPE STERI IOBAN 125X83 (DRAPES) ×1 IMPLANT
DRAPE SURG ORHT 6 SPLT 77X108 (DRAPES) ×2 IMPLANT
DRAPE U-SHAPE 47X51 STRL (DRAPES) ×3 IMPLANT
DRILL 4.3MMS DISTAL GRADUATED (BIT) ×3
DRSG MEPILEX BORDER 4X4 (GAUZE/BANDAGES/DRESSINGS) ×8 IMPLANT
DRSG PAD ABDOMINAL 8X10 ST (GAUZE/BANDAGES/DRESSINGS) ×1 IMPLANT
DURAPREP 26ML APPLICATOR (WOUND CARE) ×3 IMPLANT
ELECT REM PT RETURN 9FT ADLT (ELECTROSURGICAL) ×3
ELECTRODE REM PT RTRN 9FT ADLT (ELECTROSURGICAL) ×1 IMPLANT
FACESHIELD WRAPAROUND (MASK) IMPLANT
FACESHIELD WRAPAROUND OR TEAM (MASK) IMPLANT
GLOVE BIO SURGEON STRL SZ7 (GLOVE) ×1 IMPLANT
GLOVE BIO SURGEON STRL SZ7.5 (GLOVE) ×5 IMPLANT
GLOVE BIO SURGEON STRL SZ8 (GLOVE) ×3 IMPLANT
GLOVE BIOGEL PI IND STRL 7.0 (GLOVE) ×1 IMPLANT
GLOVE BIOGEL PI IND STRL 7.5 (GLOVE) IMPLANT
GLOVE BIOGEL PI IND STRL 8 (GLOVE) ×1 IMPLANT
GLOVE BIOGEL PI IND STRL 8.5 (GLOVE) IMPLANT
GLOVE BIOGEL PI INDICATOR 7.0 (GLOVE)
GLOVE BIOGEL PI INDICATOR 7.5 (GLOVE) ×2
GLOVE BIOGEL PI INDICATOR 8 (GLOVE) ×2
GLOVE BIOGEL PI INDICATOR 8.5 (GLOVE) ×2
GLOVE SURG SS PI 7.5 STRL IVOR (GLOVE) ×2 IMPLANT
GLOVE SURG SS PI 8.5 STRL IVOR (GLOVE) ×2
GLOVE SURG SS PI 8.5 STRL STRW (GLOVE) IMPLANT
GOWN STRL REIN XL XLG (GOWN DISPOSABLE) ×6 IMPLANT
GUIDEPIN 3.2X17.5 THRD DISP (PIN) ×2 IMPLANT
GUIDEWIRE BALL NOSE 80CM (WIRE) ×2 IMPLANT
HIP FRA NAIL LAG SCREW 10.5X90 (Orthopedic Implant) ×3 IMPLANT
KIT BASIN OR (CUSTOM PROCEDURE TRAY) ×3 IMPLANT
MANIFOLD NEPTUNE II (INSTRUMENTS) ×2 IMPLANT
NAIL HIP FRAC RT 130 11MX400M (Nail) ×2 IMPLANT
NS IRRIG 1000ML POUR BTL (IV SOLUTION) ×2 IMPLANT
PACK GENERAL/GYN (CUSTOM PROCEDURE TRAY) ×1 IMPLANT
PACK LOWER EXTREMITY WL (CUSTOM PROCEDURE TRAY) ×1 IMPLANT
PACK TOTAL JOINT (CUSTOM PROCEDURE TRAY) ×2 IMPLANT
PAD CAST 4YDX4 CTTN HI CHSV (CAST SUPPLIES) ×1 IMPLANT
PADDING CAST COTTON 4X4 STRL (CAST SUPPLIES)
POSITIONER SURGICAL ARM (MISCELLANEOUS) ×3 IMPLANT
SCREW CORTICL BON 5.0MM X 48MM (Screw) IMPLANT
SCREW LAG HIP FRA NAIL 10.5X90 (Orthopedic Implant) IMPLANT
SCREWDRIVER HEX TIP 3.5MM (MISCELLANEOUS) ×2 IMPLANT
SPONGE GAUZE 4X4 12PLY (GAUZE/BANDAGES/DRESSINGS) ×1 IMPLANT
STAPLER VISISTAT (STAPLE) ×3 IMPLANT
STOCKINETTE 8 INCH (MISCELLANEOUS) ×3 IMPLANT
SUT VIC AB 2-0 CT1 27 (SUTURE) ×3
SUT VIC AB 2-0 CT1 27XBRD (SUTURE) IMPLANT
SUT VIC AB 3-0 PS2 18 (SUTURE) ×3
SUT VIC AB 3-0 PS2 18XBRD (SUTURE) IMPLANT
TOWEL OR 17X26 10 PK STRL BLUE (TOWEL DISPOSABLE) ×3 IMPLANT
TRAY FOLEY CATH 14FRSI W/METER (CATHETERS) ×1 IMPLANT

## 2013-09-30 NOTE — Interval H&P Note (Signed)
History and Physical Interval Note:  09/30/2013 3:37 PM  [name]  has presented today for surgery, with the diagnosis of fracture right hip  The various methods of treatment have been discussed with the patient and family. After consideration of risks, benefits and other options for treatment, the patient has consented to  Procedure(s): INTRAMEDULLARY (IM) NAIL FEMORAL (N/A) as a surgical intervention .  The patient's history has been reviewed, patient examined, no change in status, stable for surgery.  I have reviewed the patient's chart and labs.  Questions were answered to the patient's satisfaction.     Kerin Salen

## 2013-09-30 NOTE — H&P (Addendum)
[facility] History and Physical  [name] ELF:[id number] DOB: 19-Oct-1950 DOA: 09/30/2013  Referring physician: ER physician. PCP: No primary provider on file.   Chief Complaint: Fall.  HPI: [name] is a 63 y.o. male with history of stroke, DVT, hypercoagulable status, polycythemia noncompliant with his medications had a fall yesterday after tripping. Had hit his head but did not close consciousness. In the ER CT head was negative for anything acute and x-rays show right hip fracture. Patient otherwise denies any chest pain shortness of breath headache focal deficits. Patient has not been taking his Coumadin as advised and patient's INR is subtherapeutic. Patient has had surgery done by Dr. Tamera Punt and wants the same group to do the surgery. I have discussed with on-call surgeon for Dr. Bettina Gavia group Dr. Mayer Camel and Dr. Mayer Camel will be seeing patient in consult.  Review of Systems: As presented in the history of presenting illness, rest negative.  Past Medical History  Diagnosis Date  . Stroke    Past Surgical History  Procedure Laterality Date  . Total hip arthroplasty     Social History:  reports that he has been smoking.  He does not have any smokeless tobacco history on file. He reports that he drinks alcohol. He reports that he does not use illicit drugs. Where does patient live home. Can patient participate in ADLs? Yes.  No Known Allergies  Family History: History reviewed. No pertinent family history.    Prior to Admission medications   Not on File    Physical Exam: Filed Vitals:   09/30/13 0016 09/30/13 0207  BP: 86/59 123/71  Pulse: 97 93  Temp: 98.4 F (36.9 C) 98.6 F (37 C)  TempSrc: Oral Oral  Resp: 14 20  SpO2: 99% 100%     General:  Well-developed and moderately nourished.  Eyes: Anicteric no pallor.  ENT: No discharge from ears eyes nose mouth.  Neck: No mass felt.  Cardiovascular: S1-S2 heard.  Respiratory: No rhonchi or  crepitations.  Abdomen: Soft nontender bowel sounds present. No guarding or rigidity.  Skin: No rash.  Musculoskeletal: Pain on moving the right hip.  Psychiatric: Appears normal.  Neurologic: Alert and oriented to time place and person. Moves all extremities.  Labs on Admission:  Basic Metabolic Panel:  Recent Labs Lab 09/30/13 0152  NA 142  K 3.4*  CL 104  GLUCOSE 108*  BUN 7  CREATININE 1.10   Liver Function Tests: No results found for this basename: AST, ALT, ALKPHOS, BILITOT, PROT, ALBUMIN,  in the last 168 hours No results found for this basename: LIPASE, AMYLASE,  in the last 168 hours No results found for this basename: AMMONIA,  in the last 168 hours CBC:  Recent Labs Lab 09/30/13 0140 09/30/13 0152  WBC 7.5  --   NEUTROABS 5.2  --   HGB 13.0 13.6  HCT 36.6* 40.0  MCV 102.5*  --   PLT 184  --    Cardiac Enzymes: No results found for this basename: CKTOTAL, CKMB, CKMBINDEX, TROPONINI,  in the last 168 hours  BNP (last 3 results) No results found for this basename: PROBNP,  in the last 8760 hours CBG: No results found for this basename: GLUCAP,  in the last 168 hours  Radiological Exams on Admission: Dg Hip Complete Right  09/30/2013   CLINICAL DATA:  History of fall complaining of right sided hip pain.  EXAM: RIGHT HIP - COMPLETE 2+ VIEW  COMPARISON:  09/28/2010.  FINDINGS: There is an acute  comminuted intertrochanteric fracture of the right hip with varus angulation of approximately 60 degrees and proximal migration of the distal fracture fragment resulting in foreshortening. Femoral head remains located in the right acetabulum. Bony pelvis appears grossly intact. Postoperative changes of ORIF for old healed comminuted intertrochanteric fracture of the left hip also noted.  IMPRESSION: 1. Acute comminuted intertrochanteric fracture of the right hip, as above.   Electronically Signed   By: Vinnie Langton M.D.   On: 09/30/2013 01:52   Dg Femur  Right  09/30/2013   CLINICAL DATA:  History of trauma from a fall complaining of right-sided hip pain.  EXAM: RIGHT FEMUR - 2 VIEW  COMPARISON:  No priors.  FINDINGS: Multiple views of the right femur demonstrate an acute comminuted and angulated intertrochanteric fracture of the right hip, with approximately 60 degrees of varus angulation and proximal migration of the distal fracture fragment resulting in foreshortening in the hip. Distal aspect of the femur is otherwise intact.  IMPRESSION: 1. Acute comminuted and angulated intertrochanteric fracture of the right hip, as above.   Electronically Signed   By: Vinnie Langton M.D.   On: 09/30/2013 01:58   Ct Head Wo Contrast  09/30/2013   CLINICAL DATA:  History of trauma from a fall.  On Coumadin.  EXAM: CT HEAD WITHOUT CONTRAST  TECHNIQUE: Contiguous axial images were obtained from the base of the skull through the vertex without intravenous contrast.  COMPARISON:  Head CT 09/28/2010  FINDINGS: Mild cerebral and cerebellar atrophy. No acute displaced skull fractures are identified. No acute intracranial abnormality. Specifically, no evidence of acute post-traumatic intracranial hemorrhage, no definite regions of acute/subacute cerebral ischemia, no focal mass, mass effect, hydrocephalus or abnormal intra or extra-axial fluid collections. The visualized paranasal sinuses and mastoids are well pneumatized, with exception of some mild multifocal mucosal thickening in the paranasal sinuses, most pronounced in the left maxillary sinus.  IMPRESSION: 1. No acute displaced skull fracture or evidence of acute intracranial trauma. 2. Mild paranasal sinus disease, as above.   Electronically Signed   By: Vinnie Langton M.D.   On: 09/30/2013 01:49   Dg Chest Port 1 View  09/30/2013   CLINICAL DATA:  pre op  EXAM: PORTABLE CHEST - 1 VIEW  COMPARISON:  None.  FINDINGS: The heart size and mediastinal contours are within normal limits. Both lungs are clear. Degenerative  changes are appreciated within the right shoulder. There is mild dextroscoliosis within the thoracic spine.  IMPRESSION: No active disease.   Electronically Signed   By: Margaree Mackintosh M.D.   On: 09/30/2013 01:51    Assessment/Plan Principal Problem:   Closed right hip fracture Active Problems:   History of DVT (deep vein thrombosis)   History of stroke   Hip fracture   1. Right hip fracture status post mechanical fall - from medical standpoint patient is stable for surgery. To start anticoagulation once okay with surgery. Patient presently kept n.p.o. in anticipation of possible surgery. Continue pain medications. 2. History of DVT - to start anticoagulation once okay with surgeon after surgery. Patient has history of DVT and hypercoagulable status. Was noncompliant with Coumadin. Presently INR subtherapeutic. 3. History of stroke - with regard to anticoagulation seen #2. 4. Tobacco abuse - tobacco cessation counseling requested. Patient states he only drinks alcohol twice a week.  EKG is pending.  Code Status: Full code.  Family Communication: None.  Disposition Plan: Admit to inpatient.    [facility] Pager [phone].  If 7PM-7AM, please contact night-coverage [email or web address] Password [facility] 09/30/2013, 3:47 AM

## 2013-09-30 NOTE — ED Provider Notes (Signed)
CSN: [id number]     Arrival date & time 09/29/13  2340 History   First MD Initiated Contact with Patient 09/30/13 [phone]     Chief Complaint  Patient presents with  . Fall  . Leg Injury     (Consider location/radiation/quality/duration/timing/severity/associated sxs/prior Treatment) HPI Comments: Patient states he lost his footing in his home, falling on his right side.  He was unable to get up to to pain in his right hip, femur area.  He also states, that he hit his head, without loss of consciousness, but it was a very light flow.  Patient currently takes Coumadin as an anticoagulant, post CVA.  Patient is a 63 y.o. male presenting with fall. The history is provided by the patient.  Fall This is a new problem. The current episode started 1 to 4 weeks ago. The problem occurs constantly. The problem has been unchanged. Pertinent negatives include no fever, headaches or joint swelling. The symptoms are aggravated by walking. He has tried nothing for the symptoms. The treatment provided no relief.    Past Medical History  Diagnosis Date  . Stroke    Past Surgical History  Procedure Laterality Date  . Total hip arthroplasty     History reviewed. No pertinent family history. History  Substance Use Topics  . Smoking status: Current Every Day Smoker  . Smokeless tobacco: Not on file  . Alcohol Use: Yes    Review of Systems  Constitutional: Negative for fever.  Eyes: Negative for visual disturbance.  Cardiovascular: Positive for leg swelling.  Musculoskeletal: Negative for joint swelling.  Skin: Negative for wound.  Neurological: Negative for dizziness and headaches.  All other systems reviewed and are negative.     Allergies  Review of patient's allergies indicates no known allergies.  Home Medications   Prior to Admission medications   Not on File   BP 123/71  Pulse 93  Temp(Src) 98.6 F (37 C) (Oral)  Resp 20  SpO2 100% Physical Exam  Nursing note and vitals  reviewed. Constitutional: He appears well-developed and well-nourished.  HENT:  Head: Normocephalic.  Eyes: Pupils are equal, round, and reactive to light.  Neck: Normal range of motion.  Cardiovascular: Normal rate and regular rhythm.   Pulmonary/Chest: Effort normal and breath sounds normal.  Abdominal: Soft. Bowel sounds are normal. He exhibits no distension.  Musculoskeletal: He exhibits tenderness. He exhibits no edema.       Legs: Neurological: He is alert.  Skin: Skin is warm and dry.    ED Course  Procedures (including critical care time) Labs Review Labs Reviewed  CBC WITH DIFFERENTIAL - Abnormal; Notable for the following:    RBC 3.57 (*)    HCT 36.6 (*)    MCV 102.5 (*)    MCH 36.4 (*)    All other components within normal limits  I-STAT CHEM 8, ED - Abnormal; Notable for the following:    Potassium 3.4 (*)    Glucose, Bld 108 (*)    Calcium, Ion 1.11 (*)    All other components within normal limits  PROTIME-INR    Imaging Review Dg Hip Complete Right  09/30/2013   CLINICAL DATA:  History of fall complaining of right sided hip pain.  EXAM: RIGHT HIP - COMPLETE 2+ VIEW  COMPARISON:  09/28/2010.  FINDINGS: There is an acute comminuted intertrochanteric fracture of the right hip with varus angulation of approximately 60 degrees and proximal migration of the distal fracture fragment resulting in foreshortening. Femoral head remains  located in the right acetabulum. Bony pelvis appears grossly intact. Postoperative changes of ORIF for old healed comminuted intertrochanteric fracture of the left hip also noted.  IMPRESSION: 1. Acute comminuted intertrochanteric fracture of the right hip, as above.   Electronically Signed   By: Vinnie Langton M.D.   On: 09/30/2013 01:52   Dg Femur Right  09/30/2013   CLINICAL DATA:  History of trauma from a fall complaining of right-sided hip pain.  EXAM: RIGHT FEMUR - 2 VIEW  COMPARISON:  No priors.  FINDINGS: Multiple views of the right  femur demonstrate an acute comminuted and angulated intertrochanteric fracture of the right hip, with approximately 60 degrees of varus angulation and proximal migration of the distal fracture fragment resulting in foreshortening in the hip. Distal aspect of the femur is otherwise intact.  IMPRESSION: 1. Acute comminuted and angulated intertrochanteric fracture of the right hip, as above.   Electronically Signed   By: Vinnie Langton M.D.   On: 09/30/2013 01:58   Ct Head Wo Contrast  09/30/2013   CLINICAL DATA:  History of trauma from a fall.  On Coumadin.  EXAM: CT HEAD WITHOUT CONTRAST  TECHNIQUE: Contiguous axial images were obtained from the base of the skull through the vertex without intravenous contrast.  COMPARISON:  Head CT 09/28/2010  FINDINGS: Mild cerebral and cerebellar atrophy. No acute displaced skull fractures are identified. No acute intracranial abnormality. Specifically, no evidence of acute post-traumatic intracranial hemorrhage, no definite regions of acute/subacute cerebral ischemia, no focal mass, mass effect, hydrocephalus or abnormal intra or extra-axial fluid collections. The visualized paranasal sinuses and mastoids are well pneumatized, with exception of some mild multifocal mucosal thickening in the paranasal sinuses, most pronounced in the left maxillary sinus.  IMPRESSION: 1. No acute displaced skull fracture or evidence of acute intracranial trauma. 2. Mild paranasal sinus disease, as above.   Electronically Signed   By: Vinnie Langton M.D.   On: 09/30/2013 01:49   Dg Chest Port 1 View  09/30/2013   CLINICAL DATA:  pre op  EXAM: PORTABLE CHEST - 1 VIEW  COMPARISON:  None.  FINDINGS: The heart size and mediastinal contours are within normal limits. Both lungs are clear. Degenerative changes are appreciated within the right shoulder. There is mild dextroscoliosis within the thoracic spine.  IMPRESSION: No active disease.   Electronically Signed   By: Margaree Mackintosh M.D.   On:  09/30/2013 01:51     EKG Interpretation None      MDM  Spoke with Dr. Damita Dunnings PA   Patient admitted to hospitalist service for medical management prior to surgical repair  Final diagnoses:  Hip fracture        Garald Balding, NP 09/30/13 [phone]

## 2013-09-30 NOTE — ED Notes (Signed)
Pt states he was walking and lost his footing causing him to fall  Pt is c/o pain to his right thigh from his knee up   Pt states he also hit his head  Denies LOC but states he was dizzy after the fall

## 2013-09-30 NOTE — Progress Notes (Signed)
INITIAL NUTRITION ASSESSMENT  Pt meets criteria for severe MALNUTRITION in the context of chronic illness as evidenced by severe fat loss throughout upper body with <75% estimated energy intake in the past month per pt report.   DOCUMENTATION CODES Per approved criteria  -Severe malnutrition in the context of chronic illness   INTERVENTION: - Diet advancement per MD - Recommend Ensure Complete TID once diet advanced, will attempt to follow up and provide pt with coupons for Ensure prior to discharge - Encouraged high calorie/protein diet to promote strength building - Recommend multivitamin 1 tablet PO daily once diet advanced  - RD to continue to monitor  NUTRITION DIAGNOSIS: Inadequate oral intake related to inability to eat as evidenced by NPO.   Goal: Advance diet as tolerated to regular diet  Monitor:  Weights, labs, diet advancement  Reason for Assessment: Low braden, malnutrition screening tool, consult for hip fracture protocol   63 y.o. male  Admitting Dx: Closed right hip fracture  ASSESSMENT: Pt with history of stroke, DVT, hypercoagulable status, polycythemia noncompliant with his medications had a fall yesterday after tripping. Had hit his head but did not close consciousness. In the ER CT head was negative for anything acute and x-rays show right hip fracture. He is an avid smoker and drinks about 1 or 2 ounces of alcohol a day.  -Pt reports being a small eater with usual intake of a snack in the morning, consisting of a sandwich, and dinner at night, consisting of vegetables and meat -Knows he's been losing weight recently but is unsure how much -Used to drink Ensure in the past but stopped because he couldn't afford it -Performed nutrition focused physical exam on upper body and found pt with severe fat loss throughout clavicles, chest, and arms with mild fat loss in temporal region    Height: Ht Readings from Last 1 Encounters:  09/30/13 6' (1.829 m)     Weight: Wt Readings from Last 1 Encounters:  09/30/13 119 lb 1.6 oz (54.023 kg)    Ideal Body Weight: 178 lbs  % Ideal Body Weight: 67%  Wt Readings from Last 10 Encounters:  09/30/13 119 lb 1.6 oz (54.023 kg)  09/30/13 119 lb 1.6 oz (54.023 kg)    Usual Body Weight: Pt unsure  BMI:  Body mass index is 16.15 kg/(m^2). Underweight  Estimated Nutritional Needs: Kcal: 2150-2350 Protein: 110-130g Fluid: 2.1-2.3L/day  Skin: Intact  Diet Order: NPO  EDUCATION NEEDS: -No education needs identified at this time  No intake or output data in the 24 hours ending 09/30/13 0917  Last BM: PTA  Labs:   Recent Labs Lab 09/30/13 0152 09/30/13 0641  NA 142 144  K 3.4* 4.0  CL 104 105  CO2  --  25  BUN 7 9  CREATININE 1.10 0.80  CALCIUM  --  8.4  GLUCOSE 108* 103*    CBG (last 3)   Recent Labs  09/30/13 0710  GLUCAP 100*    Scheduled Meds: .  ceFAZolin (ANCEF) IV  2 g Intravenous 60 min Pre-Op    Continuous Infusions: . sodium chloride 50 mL/hr at 09/30/13 7829    Past Medical History  Diagnosis Date  . Stroke     Past Surgical History  Procedure Laterality Date  . Total hip arthroplasty      Mikey College MS, RD, LDN [phone] Pager [phone] After Hours Pager

## 2013-09-30 NOTE — Anesthesia Preprocedure Evaluation (Signed)
Anesthesia Evaluation  Patient identified by MRN, date of birth, ID band Patient awake    Reviewed: Allergy & Precautions, H&P , NPO status , Patient's Chart, lab work & pertinent test results  Airway Mallampati: II TM Distance: >3 FB Neck ROM: Full    Dental no notable dental hx. (+) Poor Dentition   Pulmonary Current Smoker,  breath sounds clear to auscultation  Pulmonary exam normal       Cardiovascular DVT Rhythm:Regular Rate:Normal     Neuro/Psych CVA (L sided weakness), Residual Symptoms negative psych ROS   GI/Hepatic negative GI ROS, Neg liver ROS,   Endo/Other  negative endocrine ROS  Renal/GU negative Renal ROS  negative genitourinary   Musculoskeletal negative musculoskeletal ROS (+)   Abdominal   Peds negative pediatric ROS (+)  Hematology negative hematology ROS (+)   Anesthesia Other Findings   Reproductive/Obstetrics negative OB ROS                           Anesthesia Physical Anesthesia Plan  ASA: III  Anesthesia Plan: General   Post-op Pain Management:    Induction: Intravenous  Airway Management Planned: Oral ETT  Additional Equipment:   Intra-op Plan:   Post-operative Plan: Extubation in OR  Informed Consent: I have reviewed the patients History and Physical, chart, labs and discussed the procedure including the risks, benefits and alternatives for the proposed anesthesia with the patient or authorized representative who has indicated his/her understanding and acceptance.   Dental advisory given  Plan Discussed with: CRNA  Anesthesia Plan Comments:         Anesthesia Quick Evaluation

## 2013-09-30 NOTE — Transfer of Care (Signed)
Immediate Anesthesia Transfer of Care Note  Patient: [name]  Procedure(s) Performed: Procedure(s): INTRAMEDULLARY (IM) NAIL FEMORAL (Right)  Patient Location: PACU  Anesthesia Type:General  Level of Consciousness: sedated  Airway & Oxygen Therapy: Patient Spontanous Breathing and Patient connected to face mask oxygen  Post-op Assessment: Report given to PACU RN and Post -op Vital signs reviewed and stable Teeth intact  Post vital signs: Reviewed and stable  Complications: No apparent anesthesia complications

## 2013-09-30 NOTE — Progress Notes (Signed)
Radiologist called report too unit:  Post-op , as expected, no complications.

## 2013-09-30 NOTE — Discharge Instructions (Addendum)
Hip Fracture, Open Reduction and Internal Fixation (ORIF) A hip fracture, or broken hip, can happen to anyone. To fix it, surgery is usually needed. One method is called open reduction and internal fixation, or ORIF for short. "Open reduction" means an incision (cut) is made to open the fracture area. This lets the surgeon see the broken bone. The bone pieces will be put back together. Some type of hardware will be used to hold the bones in place. That is called "internal fixation." Screws, pins, rods or a metal plate might be used. More than 250,000 people in the [place] break a hip every year. Nearly all of them are treated successfully with surgery. LET YOUR CAREGIVER KNOW ABOUT : On the day of your surgery, your caregivers will need to know the last time you had anything to eat or drink. This includes water, gum and candy. Also make sure they know about:   Any allergies.  All medications you are taking, including:  Herbs, eyedrops, over-the-counter medications and creams.  Blood thinners (anticoagulants), aspirin or other drugs that could affect blood clotting.  Use of steroids (by mouth or as creams).  Previous problems with anesthesia, including local anesthetics.  Possibility of pregnancy, if this applies.  Any history of blood clots.  Any history of bleeding or other blood problems.  Previous surgery.  Family history of anesthetic complications  Smoking history.  Any recent symptoms of colds or infections.  Other health problems. RISKS AND COMPLICATIONS  All operations have some risk. Being unhealthy increases risks. That is why you want to be as healthy as possible before this surgery. Possible problems after ORIF may include:  Blood clots.  Bleeding.  Infection near the incision.  Lung infection (pneumonia).  Pain that continues after the operation.  Trouble walking. Some people may need to continue using a walker. BEFORE THE PROCEDURE You should be as  healthy as possible before surgery for a broken hip. Sometimes this means waiting until other health problems are addressed. Then, the operation can be scheduled. To find out if you are ready for surgery:  A medical evaluation will be done. This examination will include checking your heart and lungs.  Imaging tests. These let the surgeon see what the fracture looks like. They could include:  X-rays to find exactly where the break is.  Computed tomography (CT) scan. A CT scan takes pictures using X-rays and a computer. This can give a better view of the broken hip.  Magnetic resonance imaging (MRI scan). It uses a magnet, radio waves and a computer. It may show a hidden fracture that cannot be seen on X-ray or CT.  Blood tests.  Urine test. It is possible to have a urinary tract infection and not know it.  Talking with an anesthesiologist. This is the person who will be in charge of the anesthesia (medication to stop the pain) during the surgery. An ORIF procedure usually is done with general anesthesia (being asleep during surgery), or a spinal anesthesia is used to make you numb (no feeling) from the waist down but awake during the operation. Ask your surgeon if there is an advantage to one type of anesthetic over the other. You will need to stop taking certain medicines.  The admitting physician will have you stop using aspirin and non-steroidal anti-inflammatory drugs (NSAIDs) for pain relief. This includes prescription drugs and over-the-counter drugs such as ibuprofen and naproxen.  If you take blood-thinners, ask your healthcare provider when you should stop taking  them. You will have to give what is called informed consent. This requires signing a legal paper that gives permission for the surgery. To give informed consent:  You must understand how the procedure is done and why.  You must be told all the risks and benefits of the procedure.  You must sign the consent. Or, a legal  guardian can do this.  Signing should be witnessed by a healthcare professional. The day before the surgery, eat only a light dinner. Then, do not eat or drink anything for at least 8 hours before the surgery. Ask if it is OK to take any needed medicines with a sip of water. PROCEDURE The preparation:  Small monitors will be put on your body. They are used to check your heart, blood pressure and oxygen level.  You will be given an intravenous line (IV). A needle will be inserted in your arm. It is hooked to a plastic tube. Medication will be able to flow directly into your body through the IV.  You will be given anesthesia.  For general anesthesia, the anesthesiologist may hold a mask gently over your face. You will breathe in gases that will make you sleep. A tube also might be put in your throat. This would let you continue to get anesthesia during the procedure.  For spinal anesthesia, a drug will be injected (shot) into the spinal cord area. This will make the body numb from the waist down.  The hip area will be scrubbed with a special solution to kill any germs.  The procedure:  Once you are asleep or numb, the surgeon will move the bones (realign the fracture) before any incisions are made. The goal is to get the bones back to their normal position.  X-rays may be taken. This is to check the position of the bones.  An incision is made over the hip. It will go through the muscles to the broken bone.  The bones will be put in place. Some type of hardware will be used to hold the bone together.  The hip is a ball-and-socket joint. The "ball" part of the joint is the very top of the upper leg bone (femur). Sometimes the very top of the upper leg bone is replaced with a man-made piece. If it is replaced, this is called a partial hip replacement. Sometimes a complete or total hip replacement will be preformed, replacing both the ball and the socket. This is the preferred treatment if  there is any appearance of arthritis in the hip joint.  The incision is closed with small stitches or staples.  A dressing (medicine and a bandage) is put over the incision.  An ORIF procedure can take several hours. AFTER THE PROCEDURE  You will stay in a recovery area until the anesthesia has worn off. Your blood pressure and pulse will be checked every so often. Then you will be taken to a hospital room.  You may continue to get fluids through the IV for awhile.  Some pain is normal after an ORIF procedure. You will probably be given pain medicine. Be sure to tell your caregivers if the pain becomes severe.  It is important to be up and moving as soon as possible after an operation. Physical therapists will help you start walking. You will probably need to use a walker for a while. Follow the therapists instructions regarding weight bearing on the injured leg.  To prevent blood clots in your legs:  You may be given  special stockings to wear.  You may need to take medicine to prevent clots.  Most people stay in the hospital for several days after this surgery.  Physical therapy is usually needed. Some people go to a rehabilitation center (a long-term care center or transitional care unit) before going home. Ask your healthcare providers what would be best for you. Often social workers are available to help you and your family make the best decision for you. HOME CARE INSTRUCTIONS   Medication.  Take any pain medicine that your surgeon suggests. Follow the directions carefully. Do not take over-the-counter painkillers unless the surgeon says it is OK. Medicine such as aspirin or ibuprofen can increase the chances of bleeding.  Your healthcare provider may prescribe a blood-thinner for several weeks to 2 months. These drugs prevent blood clots.  Wound care.  Check the area around the incision carefully each day. Look for any redness or swelling. Also check for any fluid that is  seeping from the incision. Tell your healthcare provider if you see anything.  Do not get the incision wet until your surgeon says it is OK.  Activity.  Most people will need the help of a walker or crutches for some time.  You will need to continue physical therapy once you are home. This often lasts for several months.  You will learn how to avoid putting stress on your hip while it heals if that is the direction given by your surgeon.  Be sure to do any exercises the therapist suggests. These exercises will help make your hip stronger.  Special equipment might make life at home easier. One example is a seat for the shower. Another is a raised toilet seat.  Ask your healthcare provider when you can resume other activities, such as work, driving or sex.  Follow-up care.  The surgeon may need to take out stitches or staples. This is usually done about two weeks after the operation.  The surgeon will do X-rays to check how your hip is healing. SEEK MEDICAL CARE IF:   You have any questions about medications.  You feel weak.  You are too tired to walk every day.  Pain continues, even after taking pain medicine.  You develop a fever of more than 100.5 F (38.1 C). SEEK IMMEDIATE MEDICAL CARE IF:   The incision becomes red or swollen. Or, it bleeds.  Your leg or foot becomes painful and swollen.  Your leg becomes pale or blue. It feels cold. It tingles or is numb.  You have trouble breathing.  You have chest pain.  You develop a fever of more than 102 F (38.9 C). Document Released: 04/20/2009 Document Revised: 07/25/2011 Document Reviewed: 04/20/2009 [facility] Patient Information 2014 [place], [place].   You have an appointment in Hematology clinic for INR check on 10/09/2013 at 11:30 am. Please arrive 30 minutes prior to your appointment at 11:00 am.   You were cared for by a hospitalist during your hospital stay. If you have any questions about your discharge  medications or the care you received while you were in the hospital after you are discharged, you can call the unit and asked to speak with the hospitalist on call if the hospitalist that took care of you is not available. Once you are discharged, your primary care physician will handle any further medical issues. Please note that NO REFILLS for any discharge medications will be authorized once you are discharged, as it is imperative that you return to your primary  care physician (or establish a relationship with a primary care physician if you do not have one) for your aftercare needs so that they can reassess your need for medications and monitor your lab values.     If you do not have a primary care physician, you can call [phone] for a physician referral.  Follow with Hematology in 5-7 days   Get CBC, CMP checked by your doctor and again as further instructed.  Get a 2 view Chest X ray done next visit if you had Pneumonia of Lung problems at the [place] reviewed and adjusted.  Please request your Prim.MD to go over all Hospital Tests and Procedure/Radiological results at the follow up, please get all Hospital records sent to your Prim MD by signing hospital release before you go home.  Activity: As tolerated with Full fall precautions use walker/cane & assistance as needed  Diet: regular  For Heart failure patients - Check your Weight same time everyday, if you gain over 2 pounds, or you develop in leg swelling, experience more shortness of breath or chest pain, call your Primary MD immediately. Follow Cardiac Low Salt Diet and 1.8 lit/day fluid restriction.  Disposition Home  If you experience worsening of your admission symptoms, develop shortness of breath, life threatening emergency, suicidal or homicidal thoughts you must seek medical attention immediately by calling 911 or calling your MD immediately  if symptoms less severe.  You Must read complete  instructions/literature along with all the possible adverse reactions/side effects for all the Medicines you take and that have been prescribed to you. Take any new Medicines after you have completely understood and accpet all the possible adverse reactions/side effects.   Do not drive and provide baby sitting services if your were admitted for syncope or siezures until you have seen by Primary MD or a Neurologist and advised to do so again.  Do not drive when taking Pain medications.   Do not take more than prescribed Pain, Sleep and Anxiety Medications  Special Instructions: If you have smoked or chewed Tobacco  in the last 2 yrs please stop smoking, stop any regular Alcohol  and or any Recreational drug use.  Wear Seat belts while driving.

## 2013-09-30 NOTE — ED Provider Notes (Signed)
Medical screening examination/treatment/procedure(s) were performed by non-physician practitioner and as supervising physician I was immediately available for consultation/collaboration.   EKG Interpretation None        Merryl Hacker, MD 09/30/13 [phone]

## 2013-09-30 NOTE — ED Notes (Signed)
Patient transported to X-ray 

## 2013-09-30 NOTE — ED Notes (Signed)
Pt is unable to urinate at this time. 

## 2013-09-30 NOTE — Consult Note (Signed)
Reason for Consult: Displaced right hip intertrochanteric fracture and 60 of varus Referring Physician: Dr. Kandace Parkins [name] is an 63 y.o. male.  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 He retired from working at [employer] and is in record and resides at his home in [place] with a girlfriend who is their most of the time. He got up to walk his dog last night slipped and fell and sustained a right hip displaced intertrochanteric fracture similar to the fracture he had on the left side in 2012 that underwent IM nailing with Dr. Tamera Punt my partner. He denies any loss of consciousness or any other injuries. He is an avid smoker and drinks about 1 or 2 ounces of alcohol a day. He was taken to [facility] were x-rays confirmed a fracture and he is had been admitted to the hospitalist service under the hip fracture protocol  Past Medical History  Diagnosis Date  . Stroke    Prior surgeries: Intramedullary nail a nailing of the left hip intertrochanteric fracture 2012  History reviewed. No pertinent family history.  Social History:  reports that he has been smoking.  He does not have any smokeless tobacco history on file. He reports that he drinks alcohol. He reports that he does not use illicit drugs.  Allergies: No Known Allergies  Medications: Patient is generally noncompliant with medicines he had a DVT is post and Coumadin and came in with an INR of 0.9. He does not know his medications well. We're waiting for them to be brought from his house  Results for orders placed during the hospital encounter of 09/30/13 (from the past 48 hour(s))  CBC WITH DIFFERENTIAL     Status: Abnormal   Collection Time    09/30/13  1:40 AM      Result Value Ref Range   WBC 7.5  4.0 - 10.5 K/uL   RBC 3.57 (*) 4.22 - 5.81 MIL/uL   Hemoglobin 13.0  13.0 - 17.0 g/dL   HCT 36.6 (*) 39.0 - 52.0 %   MCV  102.5 (*) 78.0 - 100.0 fL   MCH 36.4 (*) 26.0 - 34.0 pg   MCHC 35.5  30.0 - 36.0 g/dL   RDW 13.6  11.5 - 15.5 %   Platelets 184  150 - 400 K/uL   Neutrophils Relative % 70  43 - 77 %   Neutro Abs 5.2  1.7 - 7.7 K/uL   Lymphocytes Relative 22  12 - 46 %   Lymphs Abs 1.6  0.7 - 4.0 K/uL   Monocytes Relative 8  3 - 12 %   Monocytes Absolute 0.6  0.1 - 1.0 K/uL   Eosinophils Relative 0  0 - 5 %   Eosinophils Absolute 0.0  0.0 - 0.7 K/uL   Basophils Relative 0  0 - 1 %   Basophils Absolute 0.0  0.0 - 0.1 K/uL  PROTIME-INR     Status: None   Collection Time    09/30/13  1:40 AM      Result Value Ref Range   Prothrombin Time 12.2  11.6 - 15.2 seconds   INR 0.92  0.00 - 1.49  I-STAT CHEM 8, ED     Status: Abnormal   Collection Time    09/30/13  1:52 AM      Result Value Ref Range   Sodium 142  137 - 147 mEq/L  Potassium 3.4 (*) 3.7 - 5.3 mEq/L   Chloride 104  96 - 112 mEq/L   BUN 7  6 - 23 mg/dL   Creatinine, Ser 1.10  0.50 - 1.35 mg/dL   Glucose, Bld 108 (*) 70 - 99 mg/dL   Calcium, Ion 1.11 (*) 1.13 - 1.30 mmol/L   TCO2 25  0 - 100 mmol/L   Hemoglobin 13.6  13.0 - 17.0 g/dL   HCT 40.0  39.0 - 52.0 %    Dg Hip Complete Right  09/30/2013   CLINICAL DATA:  History of fall complaining of right sided hip pain.  EXAM: RIGHT HIP - COMPLETE 2+ VIEW  COMPARISON:  09/28/2010.  FINDINGS: There is an acute comminuted intertrochanteric fracture of the right hip with varus angulation of approximately 60 degrees and proximal migration of the distal fracture fragment resulting in foreshortening. Femoral head remains located in the right acetabulum. Bony pelvis appears grossly intact. Postoperative changes of ORIF for old healed comminuted intertrochanteric fracture of the left hip also noted.  IMPRESSION: 1. Acute comminuted intertrochanteric fracture of the right hip, as above.   Electronically Signed   By: Vinnie Langton M.D.   On: 09/30/2013 01:52   Dg Femur Right  09/30/2013   CLINICAL  DATA:  History of trauma from a fall complaining of right-sided hip pain.  EXAM: RIGHT FEMUR - 2 VIEW  COMPARISON:  No priors.  FINDINGS: Multiple views of the right femur demonstrate an acute comminuted and angulated intertrochanteric fracture of the right hip, with approximately 60 degrees of varus angulation and proximal migration of the distal fracture fragment resulting in foreshortening in the hip. Distal aspect of the femur is otherwise intact.  IMPRESSION: 1. Acute comminuted and angulated intertrochanteric fracture of the right hip, as above.   Electronically Signed   By: Vinnie Langton M.D.   On: 09/30/2013 01:58   Ct Head Wo Contrast  09/30/2013   CLINICAL DATA:  History of trauma from a fall.  On Coumadin.  EXAM: CT HEAD WITHOUT CONTRAST  TECHNIQUE: Contiguous axial images were obtained from the base of the skull through the vertex without intravenous contrast.  COMPARISON:  Head CT 09/28/2010  FINDINGS: Mild cerebral and cerebellar atrophy. No acute displaced skull fractures are identified. No acute intracranial abnormality. Specifically, no evidence of acute post-traumatic intracranial hemorrhage, no definite regions of acute/subacute cerebral ischemia, no focal mass, mass effect, hydrocephalus or abnormal intra or extra-axial fluid collections. The visualized paranasal sinuses and mastoids are well pneumatized, with exception of some mild multifocal mucosal thickening in the paranasal sinuses, most pronounced in the left maxillary sinus.  IMPRESSION: 1. No acute displaced skull fracture or evidence of acute intracranial trauma. 2. Mild paranasal sinus disease, as above.   Electronically Signed   By: Vinnie Langton M.D.   On: 09/30/2013 01:49   Dg Chest Port 1 View  09/30/2013   CLINICAL DATA:  pre op  EXAM: PORTABLE CHEST - 1 VIEW  COMPARISON:  None.  FINDINGS: The heart size and mediastinal contours are within normal limits. Both lungs are clear. Degenerative changes are appreciated within  the right shoulder. There is mild dextroscoliosis within the thoracic spine.  IMPRESSION: No active disease.   Electronically Signed   By: Margaree Mackintosh M.D.   On: 09/30/2013 01:51    ROS patient denies any chest pain, denies any shortness of breath, and again denies any loss of consciousness per Blood pressure 103/71, pulse 108, temperature 98.7 F (37.1 C), temperature  source Oral, resp. rate 18, height 6' (1.829 m), weight 54.023 kg (119 lb 1.6 oz), SpO2 100.00%.  Physical Exam: Patient is mildly disheveled. Patient is lying in hospital bed on his left side with his right hip flexed 30 has no pain with motion of his knee. He can move his foot up and down without any difficulty has normal sensation to his foot and has normal pulses. Any attempts at moving his hip causes severe pain but if he lays still he is not in much discomfort. He has a full range of motion of shoulders elbows and wrists there are no cuts scrapes or abrasions on his skin  Assessment/Plan: Assessment: Displaced right hip intertrochanteric fracture secondary to a slip and fall at home yesterday evening.  Plan: Risks and benefits of open reduction internal fixation with IM rod were discussed and reinforced with the patient will get him on the surgery schedule today I have called the operating room and we should be able to get this done sometime after 2 PM. Meanwhile he'll be n.p.o. and we will hold off on any chemoprophylaxis for DVT pending surgery. SCDs are okay.  Kerin Salen 09/30/2013, 6:32 AM

## 2013-09-30 NOTE — H&P (View-Only) (Signed)
Reason for Consult: Displaced right hip intertrochanteric fracture and 60 of varus Referring Physician: Dr. Kandace Parkins [name] is an 63 y.o. male.  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 He retired from working at [employer] and is in record and resides at his home in [place] with a girlfriend who is their most of the time. He got up to walk his dog last night slipped and fell and sustained a right hip displaced intertrochanteric fracture similar to the fracture he had on the left side in 2012 that underwent IM nailing with Dr. Tamera Punt my partner. He denies any loss of consciousness or any other injuries. He is an avid smoker and drinks about 1 or 2 ounces of alcohol a day. He was taken to [facility] were x-rays confirmed a fracture and he is had been admitted to the hospitalist service under the hip fracture protocol  Past Medical History  Diagnosis Date  . Stroke    Prior surgeries: Intramedullary nail a nailing of the left hip intertrochanteric fracture 2012  History reviewed. No pertinent family history.  Social History:  reports that he has been smoking.  He does not have any smokeless tobacco history on file. He reports that he drinks alcohol. He reports that he does not use illicit drugs.  Allergies: No Known Allergies  Medications: Patient is generally noncompliant with medicines he had a DVT is post and Coumadin and came in with an INR of 0.9. He does not know his medications well. We're waiting for them to be brought from his house  Results for orders placed during the hospital encounter of 09/30/13 (from the past 48 hour(s))  CBC WITH DIFFERENTIAL     Status: Abnormal   Collection Time    09/30/13  1:40 AM      Result Value Ref Range   WBC 7.5  4.0 - 10.5 K/uL   RBC 3.57 (*) 4.22 - 5.81 MIL/uL   Hemoglobin 13.0  13.0 - 17.0 g/dL   HCT 36.6 (*) 39.0 - [name].0 %   MCV  102.5 (*) 78.0 - 100.0 fL   MCH 36.4 (*) 26.0 - 34.0 pg   MCHC 35.5  30.0 - 36.0 g/dL   RDW 13.6  11.5 - 15.5 %   Platelets 184  150 - 400 K/uL   Neutrophils Relative % 70  43 - 77 %   Neutro Abs 5.2  1.7 - 7.7 K/uL   Lymphocytes Relative 22  12 - 46 %   Lymphs Abs 1.6  0.7 - 4.0 K/uL   Monocytes Relative 8  3 - 12 %   Monocytes Absolute 0.6  0.1 - 1.0 K/uL   Eosinophils Relative 0  0 - 5 %   Eosinophils Absolute 0.0  0.0 - 0.7 K/uL   Basophils Relative 0  0 - 1 %   Basophils Absolute 0.0  0.0 - 0.1 K/uL  PROTIME-INR     Status: None   Collection Time    09/30/13  1:40 AM      Result Value Ref Range   Prothrombin Time 12.2  11.6 - 15.2 seconds   INR 0.92  0.00 - 1.49  I-STAT CHEM 8, ED     Status: Abnormal   Collection Time    09/30/13  1:[name] AM      Result Value Ref Range   Sodium 142  137 - 147 mEq/L  Potassium 3.4 (*) 3.7 - 5.3 mEq/L   Chloride 104  96 - 112 mEq/L   BUN 7  6 - 23 mg/dL   Creatinine, Ser 1.10  0.50 - 1.35 mg/dL   Glucose, Bld 108 (*) 70 - 99 mg/dL   Calcium, Ion 1.11 (*) 1.13 - 1.30 mmol/L   TCO2 25  0 - 100 mmol/L   Hemoglobin 13.6  13.0 - 17.0 g/dL   HCT 40.0  39.0 - [name].0 %    Dg Hip Complete Right  09/30/2013   CLINICAL DATA:  History of fall complaining of right sided hip pain.  EXAM: RIGHT HIP - COMPLETE 2+ VIEW  COMPARISON:  09/28/2010.  FINDINGS: There is an acute comminuted intertrochanteric fracture of the right hip with varus angulation of approximately 60 degrees and proximal migration of the distal fracture fragment resulting in foreshortening. Femoral head remains located in the right acetabulum. Bony pelvis appears grossly intact. Postoperative changes of ORIF for old healed comminuted intertrochanteric fracture of the left hip also noted.  IMPRESSION: 1. Acute comminuted intertrochanteric fracture of the right hip, as above.   Electronically Signed   By: Daniel  Entrikin M.D.   On: 09/30/2013 01:[name]   Dg Femur Right  09/30/2013   CLINICAL  DATA:  History of trauma from a fall complaining of right-sided hip pain.  EXAM: RIGHT FEMUR - 2 VIEW  COMPARISON:  No priors.  FINDINGS: Multiple views of the right femur demonstrate an acute comminuted and angulated intertrochanteric fracture of the right hip, with approximately 60 degrees of varus angulation and proximal migration of the distal fracture fragment resulting in foreshortening in the hip. Distal aspect of the femur is otherwise intact.  IMPRESSION: 1. Acute comminuted and angulated intertrochanteric fracture of the right hip, as above.   Electronically Signed   By: Daniel  Entrikin M.D.   On: 09/30/2013 01:58   Ct Head Wo Contrast  09/30/2013   CLINICAL DATA:  History of trauma from a fall.  On Coumadin.  EXAM: CT HEAD WITHOUT CONTRAST  TECHNIQUE: Contiguous axial images were obtained from the base of the skull through the vertex without intravenous contrast.  COMPARISON:  Head CT 09/28/2010  FINDINGS: Mild cerebral and cerebellar atrophy. No acute displaced skull fractures are identified. No acute intracranial abnormality. Specifically, no evidence of acute post-traumatic intracranial hemorrhage, no definite regions of acute/subacute cerebral ischemia, no focal mass, mass effect, hydrocephalus or abnormal intra or extra-axial fluid collections. The visualized paranasal sinuses and mastoids are well pneumatized, with exception of some mild multifocal mucosal thickening in the paranasal sinuses, most pronounced in the left maxillary sinus.  IMPRESSION: 1. No acute displaced skull fracture or evidence of acute intracranial trauma. 2. Mild paranasal sinus disease, as above.   Electronically Signed   By: Daniel  Entrikin M.D.   On: 09/30/2013 01:49   Dg Chest Port 1 View  09/30/2013   CLINICAL DATA:  pre op  EXAM: PORTABLE CHEST - 1 VIEW  COMPARISON:  None.  FINDINGS: The heart size and mediastinal contours are within normal limits. Both lungs are clear. Degenerative changes are appreciated within  the right shoulder. There is mild dextroscoliosis within the thoracic spine.  IMPRESSION: No active disease.   Electronically Signed   By: Hector  Cooper M.D.   On: 09/30/2013 01:51    ROS patient denies any chest pain, denies any shortness of breath, and again denies any loss of consciousness per Blood pressure 103/71, pulse 108, temperature 98.7 F (37.1 C), temperature   source Oral, resp. rate 18, height 6' (1.829 m), weight 54.023 kg (119 lb 1.6 oz), SpO2 100.00%.  Physical Exam: Patient is mildly disheveled. Patient is lying in hospital bed on his left side with his right hip flexed 30 has no pain with motion of his knee. He can move his foot up and down without any difficulty has normal sensation to his foot and has normal pulses. Any attempts at moving his hip causes severe pain but if he lays still he is not in much discomfort. He has a full range of motion of shoulders elbows and wrists there are no cuts scrapes or abrasions on his skin  Assessment/Plan: Assessment: Displaced right hip intertrochanteric fracture secondary to a slip and fall at home yesterday evening.  Plan: Risks and benefits of open reduction internal fixation with IM rod were discussed and reinforced with the patient will get him on the surgery schedule today I have called the operating room and we should be able to get this done sometime after 2 PM. Meanwhile he'll be n.p.o. and we will hold off on any chemoprophylaxis for DVT pending surgery. SCDs are okay.  Bethene Hankinson J Joel Mericle 09/30/2013, 6:32 AM      

## 2013-09-30 NOTE — Progress Notes (Signed)
ANTICOAGULATION CONSULT NOTE - Initial Consult  Pharmacy Consult for Warfarin Indication: VTE prophylaxis  No Known Allergies  Patient Measurements: Height: 6' (182.9 cm) Weight: 119 lb 1.6 oz (54.023 kg) IBW/kg (Calculated) : 77.6  Vital Signs: Temp: 98.5 F (36.9 C) (05/18 1818) Temp src: Oral (05/18 1341) BP: 117/76 mmHg (05/18 1818) Pulse Rate: 76 (05/18 1818)  Labs:  Recent Labs  09/30/13 0140 09/30/13 0152 09/30/13 0641  HGB 13.0 13.6 11.2*  HCT 36.6* 40.0 32.0*  PLT 184  --  156  LABPROT 12.2  --   --   INR 0.92  --   --   CREATININE  --  1.10 0.80    Estimated Creatinine Clearance: 72.2 ml/min (by C-G formula based on Cr of 0.8).   Medical History: Past Medical History  Diagnosis Date  . Stroke     Medications:  Scheduled:  . docusate sodium  100 mg Oral BID  . fentaNYL       Infusions:  . sodium chloride Stopped (09/30/13 1509)  . dextrose 5 % and 0.45 % NaCl with KCl 20 mEq/L     PRN: acetaminophen, acetaminophen, bisacodyl, HYDROcodone-acetaminophen, menthol-cetylpyridinium, methocarbamol (ROBAXIN) IV, methocarbamol, metoCLOPramide (REGLAN) injection, metoCLOPramide, morphine injection, ondansetron (ZOFRAN) IV, ondansetron, phenol, senna-docusate  Assessment: 63 yo male with PMHx stroke and VTE, noncompliant with anticoagulation, also polycythemia, prior left hip fx surgery repair, and alcohol and tobacco abuse, admitted 5/18 for right hip fracture, s/p ORIF. INR on admit = 0.92. Pt unfamiliar with PTA medications.  Goal of Therapy:  INR 2-3   Plan:   Warfarin 7.5mg  PO x 1 tonight  Daily PT/INR  Provide warfarin education prior to discharge  Peggyann Juba, PharmD, BCPS Pager: [phone] 09/30/2013,7:04 PM

## 2013-09-30 NOTE — Progress Notes (Signed)
Clinical Social Work  CSW attempted to meet with patient in order to complete assessment for plans at DC. CSW went to room but patient out of room for procedure. CSW will follow up at later time.  [place], [place] [phone]

## 2013-09-30 NOTE — Progress Notes (Signed)
Followup note: Patient admitted earlier this morning. Seen after arrival to floor.  Patient Active Problem List   Diagnosis Date Noted  . Closed right hip fracture : for surgery this afternoon  09/30/2013  . History of DVT (deep vein thrombosis): Noncompliant with anticoagulation  09/30/2013  . History of stroke: Patient is to be on anticoagulation 09/30/2013    09/30/2013  . Protein-calorie malnutrition, severe: Patient meets criteria for severe malnutrition in the context of chronic illness as evidenced by severe fat loss throughout upper body with less than 75% estimated energy intake in the past month per patient report. Appreciate nutrition help. We'll start Ensure 3 times a day plus multivitamin plus high-calorie protein diet after surgery  09/30/2013  . Tobacco abuse: Declines patch  09/30/2013

## 2013-09-30 NOTE — Anesthesia Procedure Notes (Signed)
Procedure Name: Intubation Date/Time: 09/30/2013 3:42 PM Performed by: Lind Covert Pre-anesthesia Checklist: Patient identified, Suction available, Emergency Drugs available, Patient being monitored and Timeout performed Patient Re-evaluated:Patient Re-evaluated prior to inductionOxygen Delivery Method: Circle system utilized Preoxygenation: Pre-oxygenation with 100% oxygen Intubation Type: IV induction Ventilation: Mask ventilation without difficulty Laryngoscope Size: Mac and 4 Grade View: Grade III Tube type: Oral Tube size: 7.5 mm Number of attempts: 2 Airway Equipment and Method: Stylet and Video-laryngoscopy Placement Confirmation: ETT inserted through vocal cords under direct vision,  positive ETCO2,  CO2 detector and breath sounds checked- equal and bilateral Secured at: 22 cm Tube secured with: Tape Dental Injury: Teeth and Oropharynx as per pre-operative assessment  Difficulty Due To: Difficulty was unanticipated and Difficult Airway- due to dentition Future Recommendations: Recommend- induction with short-acting agent, and alternative techniques readily available

## 2013-09-30 NOTE — Op Note (Signed)
DATE OF PROCEDURE: 12/22/2011  PREOPERATIVE DIAGNOSIS: R hip intertrochanteric, subtrochanteric fracture 3-part with varus displacement  POSTOPERATIVE DIAGNOSIS: Same  PROCEDURE: Open reduction internal fixation left hip intertrochanteric fracture using a 11 mm x 400 mm trochanteric nail, 90 mm lag screw SURGEON: Nene Aranas J  ASSISTANT: Eric K. Barton Dubois  (present throughout entire procedure and necessary for timely completion of the procedure) ANESTHESIA: General  BLOOD LOSS: 300 cc  FLUID REPLACEMENT: 1200 cc crystalloid  DRAINS: Foley Catheter  URINE OUTPUT: 160VP  COMPLICATIONS: none   INDICATIONS FOR PROCEDURE: R hip intertrochanteric, subtrochanteric fracture, 3-part, sustained from a fall. Patient. presented to the emergency room, was admitted by the medicine service and orthopedic consultation was obtained. To decrease pain and increase function we have recommended open reduction internal fixation using a trochanteric nail similar to the device used on his left hip in 2012. The risks, benefits, and alternatives were discussed at length including but not limited to the risks of infection, bleeding, nerve injury, stiffness, blood clots, the need for revision surgery, cardiopulmonary complications, among others, and they were willing to proceed. Benefits have been discussed. Questions answered.   PROCEDURE IN DETAIL: The patient was identified by armband,  received preoperative IV antibiotics in the holding area, taken to the operating room , appropriate anesthetic monitors were attached and general endotracheal anesthesia induced. Pt. was then transferred to a radiolucent flat Jackson table, rolled into the left lateral decubitus position and fixed there with a Stulberg Mark 2 pelvic clamp. Under C-arm imaging control we then performed a closed reduction with abduction and internal rotation, and a rolled towel in the groin to lateralize the proximal femur, obtaining a near-anatomic  reduction with the leg in full extension with mild traction. The lateral aspect of the hip and thigh was then prepped and draped in usual sterile fashion in the iliac crest to the knee. A timeout procedure was performed. Under C-arm imaging a guidepin was placed coaxial with the femur on the lateral view A 3 centimeter incision starting 8 cm proximal to the greater trochanter. Under C-arm imaging control a trocar tip guidepin was then placed at the tip of the greater trochanter through the fracture site and into the intramedullary canal followed by the soft tissue protector over the guidepin and the proximal reamer from the Biomet trochanteric nail set. We then passed a ball-tipped guidewire down to the lumen Statz line and the knee under C-arm control and overreamed with a 12.5 mm flexible reamer. Using the ball-tipped guidewire we measured for a 400 mm nail which was then loaded on inserter and driven to the appropriate depth into the femur. With a driving platform in place we then took lateral images with the C-arm setting are anteversion so that the guidepin would go into the center of the femoral head. The pin guide was then placed to the driving platform showing [place] where to make a 2 cm incision in the lateral thigh and the lag screw pin guide was then placed against the cortex and a guide pin driven through the lateral cortex through the nail and the center of the femoral head. We measured for a 90 mm screw and set the reamer to 90 mm. We then loaded a 90 mm lag screw onto the T-handle and screwed over the guide pin up in the center of the femoral head 1 cm away from the subchondral bone. C-arm images were taken confirming center position in the AP and lateral views. We then direct her attention  distally and using the perfect hole technique placed our distal locking screw which was 48 mm in the proximal distal screw hole. We took final C-arm images in AP and lateral planes and saved them. The wounds was then  thoroughly irrigated with normal saline solution. The subcutaneous and subcuticular tissues were then closed with running 3-0 Vicryl suture there. A dressing of Mepilex was then applied the patient was unclamped rolled supine awakened extubated and taken to the recovery room without difficulty.  Frederik Pear J  12/22/2011, 7:29 PM

## 2013-09-30 NOTE — Anesthesia Postprocedure Evaluation (Signed)
  Anesthesia Post-op Note  Patient: [name]  Procedure(s) Performed: Procedure(s) (LRB): INTRAMEDULLARY (IM) NAIL FEMORAL (Right)  Patient Location: PACU  Anesthesia Type: General  Level of Consciousness: awake and alert   Airway and Oxygen Therapy: Patient Spontanous Breathing  Post-op Pain: mild  Post-op Assessment: Post-op Vital signs reviewed, Patient's Cardiovascular Status Stable, Respiratory Function Stable, Patent Airway and No signs of Nausea or vomiting  Last Vitals:  Filed Vitals:   09/30/13 1818  BP: 117/76  Pulse: 76  Temp: 36.9 C  Resp: 15    Post-op Vital Signs: stable   Complications: No apparent anesthesia complications

## 2013-10-01 ENCOUNTER — Encounter (HOSPITAL_COMMUNITY): Payer: Self-pay | Admitting: Orthopedic Surgery

## 2013-10-01 DIAGNOSIS — R7309 Other abnormal glucose: Secondary | ICD-10-CM

## 2013-10-01 DIAGNOSIS — R739 Hyperglycemia, unspecified: Secondary | ICD-10-CM | POA: Diagnosis not present

## 2013-10-01 DIAGNOSIS — D62 Acute posthemorrhagic anemia: Secondary | ICD-10-CM | POA: Diagnosis not present

## 2013-10-01 LAB — GLUCOSE, CAPILLARY
GLUCOSE-CAPILLARY: 167 mg/dL — AB (ref 70–99)
Glucose-Capillary: 94 mg/dL (ref 70–99)
Glucose-Capillary: 104 mg/dL — ABNORMAL HIGH (ref 70–99)
Glucose-Capillary: 125 mg/dL — ABNORMAL HIGH (ref 70–99)

## 2013-10-01 LAB — CBC
HEMATOCRIT: 23.9 % — AB (ref 39.0–52.0)
Hemoglobin: 8.4 g/dL — ABNORMAL LOW (ref 13.0–17.0)
MCH: 36.2 pg — ABNORMAL HIGH (ref 26.0–34.0)
MCHC: 35.1 g/dL (ref 30.0–36.0)
MCV: 103 fL — ABNORMAL HIGH (ref 78.0–100.0)
PLATELETS: 124 10*3/uL — AB (ref 150–400)
RBC: 2.32 MIL/uL — AB (ref 4.22–5.81)
RDW: 13.4 % (ref 11.5–15.5)
WBC: 6.2 10*3/uL (ref 4.0–10.5)

## 2013-10-01 LAB — URINALYSIS, ROUTINE W REFLEX MICROSCOPIC
Bilirubin Urine: NEGATIVE
GLUCOSE, UA: 500 mg/dL — AB
Hgb urine dipstick: NEGATIVE
KETONES UR: NEGATIVE mg/dL
LEUKOCYTES UA: NEGATIVE
Nitrite: NEGATIVE
PH: 6 (ref 5.0–8.0)
Protein, ur: NEGATIVE mg/dL
Specific Gravity, Urine: 1.023 (ref 1.005–1.030)
Urobilinogen, UA: 1 mg/dL (ref 0.0–1.0)

## 2013-10-01 LAB — BASIC METABOLIC PANEL
BUN: 10 mg/dL (ref 6–23)
CHLORIDE: 100 meq/L (ref 96–112)
CO2: 23 mEq/L (ref 19–32)
Calcium: 8.2 mg/dL — ABNORMAL LOW (ref 8.4–10.5)
Creatinine, Ser: 0.74 mg/dL (ref 0.50–1.35)
GFR calc Af Amer: >90 mL/min (ref >90)
GFR calc non Af Amer: >90 mL/min (ref >90)
Glucose, Bld: 268 mg/dL — ABNORMAL HIGH (ref 70–99)
POTASSIUM: 4.1 meq/L (ref 3.7–5.3)
Sodium: 135 mEq/L — ABNORMAL LOW (ref 137–147)

## 2013-10-01 LAB — HEMOGLOBIN A1C
HEMOGLOBIN A1C: 5.3 % (ref <5.7)
Mean Plasma Glucose: 105 mg/dL (ref <117)

## 2013-10-01 LAB — PROTIME-INR
INR: 1.04 (ref 0.00–1.49)
PROTHROMBIN TIME: 13.4 s (ref 11.6–15.2)

## 2013-10-01 MED ORDER — WARFARIN SODIUM 7.5 MG PO TABS
7.5000 mg | ORAL_TABLET | Freq: Once | ORAL | Status: AC
  Administered 2013-10-01: 7.5 mg via ORAL
  Filled 2013-10-01: qty 1

## 2013-10-01 MED ORDER — ENOXAPARIN SODIUM 30 MG/0.3ML ~~LOC~~ SOLN: 30.0000 mg | Freq: Two times a day (BID) | SUBCUTANEOUS | Status: DC

## 2013-10-01 MED ORDER — WARFARIN VIDEO
Freq: Once | Status: AC
  Administered 2013-10-01: 10:00:00

## 2013-10-01 MED ORDER — TIZANIDINE HCL 2 MG PO CAPS: 4.0000 mg | ORAL_CAPSULE | Freq: Three times a day (TID) | ORAL | Status: DC | PRN

## 2013-10-01 MED ORDER — INSULIN ASPART 100 UNIT/ML ~~LOC~~ SOLN: 0.0000 [IU] | Freq: Three times a day (TID) | SUBCUTANEOUS | Status: DC

## 2013-10-01 MED ORDER — COUMADIN BOOK
Freq: Once | Status: AC
  Administered 2013-10-01: 1
  Filled 2013-10-01: qty 1

## 2013-10-01 MED ORDER — OXYCODONE-ACETAMINOPHEN 5-325 MG PO TABS: 1.0000 | ORAL_TABLET | ORAL | Status: DC | PRN

## 2013-10-01 MED ORDER — ENOXAPARIN SODIUM 30 MG/0.3ML ~~LOC~~ SOLN
30.0000 mg | Freq: Two times a day (BID) | SUBCUTANEOUS | Status: DC
  Administered 2013-10-01 – 2013-10-02 (×3): 30 mg via SUBCUTANEOUS
  Filled 2013-10-01 (×5): qty 0.3

## 2013-10-01 MED ORDER — WARFARIN SODIUM 5 MG PO TABS: ORAL_TABLET | ORAL | Status: DC

## 2013-10-01 MED ORDER — INSULIN ASPART 100 UNIT/ML ~~LOC~~ SOLN: 0.0000 [IU] | Freq: Every day | SUBCUTANEOUS | Status: DC

## 2013-10-01 NOTE — Progress Notes (Signed)
Clinical Social Work  After patient spoke with MD and CM, patient is now agreeable to SNF. Patient will review list and is agreeable to [facility] search. CSW completed FL2 and faxed out. CSW will follow up with bed offers.  [place], [place] [phone]

## 2013-10-01 NOTE — Evaluation (Signed)
Occupational Therapy Evaluation Patient Details Name: [name] MRN: [id number] DOB: 1951/03/16 Today's Date: 10/01/2013    History of Present Illness 63 y.o. male with h/o CVA, L hip fx admitted with R hip fx, s/p IM nail with posterior precautions per ortho progress note.   Clinical Impression   Pt was admitted for the above surgery.  Uncertain of PLOF--information was conflicting.  Pt needs up to max A for LB adls and will have family assistance at home.  Will follow in acute to reinforce THPs and increase independence with adls and toilet transfers.      Follow Up Recommendations  Supervision/Assistance - 24 hour;Home health OT    Equipment Recommendations  3 in 1 bedside comode    Recommendations for Other Services       Precautions / Restrictions Precautions Precautions: Posterior Hip Precaution Booklet Issued: Yes (comment) Precaution Comments: sign in room.  MDs progress notes say posterior prec. Orders only say pillow between legs Restrictions Weight Bearing Restrictions: Yes RLE Weight Bearing: Partial weight bearing RLE Partial Weight Bearing Percentage or Pounds: 50%      Mobility Bed Mobility PER PT: Overal bed mobility: Needs Assistance Bed Mobility: Supine to Sit     Supine to sit: Min assist     General bed mobility comments: Min A to support RLE  Transfers:  PER PT: Overall transfer level: Needs assistance Equipment used: Rolling walker (2 wheeled) Transfers: Sit to/from Stand Sit to Stand: Min guard         General transfer comment: cues for hand placement, min/guard for safety  OT observed pt ambulating with PT.  Did not stand again during OT eval, which followed PT    Balance                                         ADL Overall ADL's : Needs assistance/impaired             Lower Body Bathing: Moderate assistance;Sit to/from stand       Lower Body Dressing: Maximal assistance;Sit to/from stand                  General ADL Comments: Pt does not have AE and had family help before:  not interested in this. Will continue to have assistance.  He can perform UB adls with set up.  He is following partial weight bearing.  Reviewed precautions and posted sign in room.       Vision                     Perception     Praxis      Pertinent Vitals/Pain R hip painful, "a lot".  Did not rate.  Repositioned and ice applied.  Premedicated     Hand Dominance Right   Extremity/Trunk Assessment Upper Extremity Assessment Upper Extremity Assessment: Overall WFL for tasks assessed          Communication Communication Communication: No difficulties   Cognition Arousal/Alertness: Awake/alert Behavior During Therapy: WFL for tasks assessed/performed Overall Cognitive Status: Within Functional Limits for tasks assessed                     General Comments       Exercises       Shoulder Instructions      Home Living Family/patient expects to be discharged to:: Private residence  Living Arrangements: Alone Available Help at Discharge: Available 24 hours/day   Home Access: Ramped entrance;Stairs to enter Entrance Stairs-Number of Steps: 3 STE in front with B rails, ramp in back, pt uses front steps Entrance Stairs-Rails: Can reach both;Left;Right       Bathroom Shower/Tub: Tub/shower unit Shower/tub characteristics: Architectural technologist: Standard     Home Equipment: Mining engineer - 2 wheels;Wheelchair - manual;Tub bench;Grab bars - tub/shower;Grab bars - toilet   Additional Comments: tub shower      Prior Functioning/Environment Level of Independence: Independent with assistive device(s)        Comments: unclear if he had assistance for adls--states he couldn't bend forward    OT Diagnosis: Generalized weakness   OT Problem List: Decreased strength;Decreased activity tolerance;Decreased knowledge of precautions;Pain   OT Treatment/Interventions:  Self-care/ADL training;DME and/or AE instruction;Patient/family education    OT Goals(Current goals can be found in the care plan section) Acute Rehab OT Goals Patient Stated Goal: to go fishing and do whatever I want OT Goal Formulation: With patient Time For Goal Achievement: 10/08/13 Potential to Achieve Goals: Good ADL Goals Pt Will Perform Grooming: with supervision;standing Pt Will Transfer to Toilet: with supervision;ambulating;bedside commode Pt Will Perform Toileting - Clothing Manipulation and hygiene: with supervision;sit to/from stand  OT Frequency: Min 2X/week   Barriers to D/C:            Co-evaluation              End of Session    Activity Tolerance: Patient tolerated treatment well Patient left: in chair;with call bell/phone within reach   Time: 1013-1025 OT Time Calculation (min): 12 min Charges:  OT General Charges $OT Visit: 1 Procedure OT Evaluation $Initial OT Evaluation Tier I: 1 Procedure G-Codes:    Lesle Chris 10-04-2013, 11:07 AM Lesle Chris, OTR/L [phone] 04-Oct-2013

## 2013-10-01 NOTE — Progress Notes (Signed)
Patient ID: [name], male   DOB: 29-Sep-1950, 63 y.o.   MRN: [id number] PATIENT ID: [name]  MRN: [id number]  DOB/AGE:  November 11, 1950 / 63 y.o.  1 Day Post-Op Procedure(s) (LRB): INTRAMEDULLARY (IM) NAIL FEMORAL (Right)    PROGRESS NOTE Subjective: Patient is alert, oriented,no Nausea, no Vomiting, yes passing gas, no Bowel Movement. Taking PO sips. Patient denies any weakness or dizziness at this time his hemoglobin is 8.4 Denies SOB, Chest or Calf Pain. Using Incentive Spirometer, PAS in place. Ambulate 50% WB RLE Patient reports pain as 3 on 0-10 scale  .    Objective: Vital signs in last 24 hours: Filed Vitals:   09/30/13 1930 09/30/13 2030 09/30/13 2130 10/01/13 0200  BP: 105/60 101/62 96/59 108/63  Pulse: 104 90 91 90  Temp: 98.8 F (37.1 C) 98.6 F (37 C) 98.4 F (36.9 C) 98.7 F (37.1 C)  TempSrc: Oral Oral Oral Oral  Resp: 20 18 18 18   Height:      Weight:      SpO2: 98% 99% 99% 100%      Intake/Output from previous day: I/O last 3 completed shifts: In: 2493.3 [I.V.:2493.3] Out: 500 [Urine:300; Blood:200]   Intake/Output this shift:     LABORATORY DATA:  Recent Labs  09/30/13 0140  09/30/13 0641  09/30/13 1139 09/30/13 2006 09/30/13 2349 10/01/13 0410  WBC 7.5  --  6.4  --   --   --   --  6.2  HGB 13.0  < > 11.2*  --   --   --   --  8.4*  HCT 36.6*  < > 32.0*  --   --   --   --  23.9*  PLT 184  --  156  --   --   --   --  124*  NA  --   < > 144  --   --   --   --  135*  K  --   < > 4.0  --   --   --   --  4.1  CL  --   < > 105  --   --   --   --  100  CO2  --   --  25  --   --   --   --  23  BUN  --   < > 9  --   --   --   --  10  CREATININE  --   < > 0.80  --   --   --   --  0.74  GLUCOSE  --   < > 103*  --   --   --   --  268*  GLUCAP  --   --   --   < > 81 107* 256*  --   INR 0.92  --   --   --   --   --   --  1.04  CALCIUM  --   < > 8.4  --   --   --   --  8.2*  < > = values in this interval not displayed.  Examination: Neurologically  intact ABD soft Neurovascular intact Sensation intact distally Intact pulses distally Dorsiflexion/Plantar flexion intact Incision: no drainage No cellulitis present Compartment soft} XR AP&Lat of hip shows well placed\fixed intramedullary rod. Assessment:   1 Day Post-Op Procedure(s) (LRB): INTRAMEDULLARY (IM) NAIL FEMORAL (Right) ADDITIONAL DIAGNOSIS:  History of DVTs and strokes, was post and  Coumadin but had normal INR and  Plan: PT/OT WBAT, THA  posterior precautions  DVT Prophylaxis: SCDx72 hrs, patient will start Lovenox to Coumadin bridge today orders written. DISCHARGE PLAN: Home, reports that his fiance is a Marine scientist and his daughters coming up from [place] to help him as he did in 2012 with his left hip fracture  DISCHARGE NEEDS: HHPT, HHRN, Walker and 3-in-1 comode seat

## 2013-10-01 NOTE — Progress Notes (Signed)
Clinical Social Work Department BRIEF PSYCHOSOCIAL ASSESSMENT 10/01/2013  Patient:  [name], [name]     Account Number:  [id number]     Admit date:  09/29/2013  Clinical Social Worker:  Earlie Server  Date/Time:  10/01/2013 09:30 AM  Referred by:  Physician  Date Referred:  10/01/2013 Referred for  SNF Placement   Other Referral:   Interview type:  Patient Other interview type:    PSYCHOSOCIAL DATA Living Status:  FAMILY Admitted from facility:   Level of care:   Primary support name:  Bill Primary support relationship to patient:  SIBLING Degree of support available:   Adequate    CURRENT CONCERNS Current Concerns  Post-Acute Placement   Other Concerns:    SOCIAL WORK ASSESSMENT / PLAN CSW received referral in order to complete psychosocial assessment. CSW reviewed chart and met with patient at bedside. CSW introduced myself and explained role.    Patient reports that he had surgery yesterday for hip but has already made plans for his DC. Patient reports his dtr and fiance are both in the nursing field and are agreeable to stay with him to provide 24 hour care for the next month. CSW provided SNF list and explained SNF process. Patient reports he is not interested in placement and feels he will get adequate care at home. CSW explained the difference in therapy from home vs SNF. Patient continues to report he wants to go home.    CSW left SNF list and agreeable to follow up after PT/OT completes evaluations. CSW updated CM re: possible HH needs.   Assessment/plan status:  Psychosocial Support/Ongoing Assessment of Needs Other assessment/ plan:   Information/referral to community resources:   SNF list    PATIENT'S/FAMILY'S RESPONSE TO PLAN OF CARE: Patient alert and oriented. Patient agreeable to assessment but does not feel that SNF placement is needed. Patient feels lucky to have supportive family and has already made arrangements to return home. Patient does not feel  SNF would be beneficial but is agreeable to [facility]. Patient agreeable for CSW to follow up after PT evaluations.       [place], [place] [phone]

## 2013-10-01 NOTE — Discharge Summary (Addendum)
Physician Discharge Summary  [name] EPP:[id number] DOB: 1951/05/10 DOA: 09/30/2013  PCP: No primary provider on file.  Admit date: 09/30/2013 Anticipated Discharge date: 10/02/2013  Time spent: 25 minutes  Recommendations for Outpatient Follow-up:  1. Patient refusing skilled nursing facility and will go home 2. Medications: Lovenox 30 mg subcutaneous every 12 hours x5 days 3. New medication: Coumadin 5 mg each bedtime x 2 weeks 4. Appointment set up for him in Hematology clinic 5/27 at 11:30 am for INR and decision to resume chronic anticoagulation per previous history of DVT  Discharge Diagnoses:  Principal Problem:   Closed right hip fracture Active Problems:   History of DVT (deep vein thrombosis)   History of stroke   Protein-calorie malnutrition, severe   Tobacco abuse   Noncompliance   Hip fracture, right   Acute blood loss anemia   Elevated blood sugar  Discharge Condition: Improved, being discharged to skilled nursing facility  Diet recommendation: Heart healthy  Filed Weights   09/30/13 0526  Weight: 54.023 kg (119 lb 1.6 oz)    History of present illness:  63 year old African American male past medical history of stroke and DVT supposed to be on Coumadin but noncompliant had a mechanical fall yesterday landing on his right side and noted to have a right-sided hip fracture. Admitted to the hospitalist service  Hospital Course:  Principal Problem:   Closed right hip fracture: Seen by orthopedic surgery. Went to operating room on 5/18. Tolerated procedure well. No complications. Postop recovering nicely. Patient noncompliant with Coumadin before. Plan is for Lovenox 30 mg subcutaneous every 12 hours x5 days as to bridge with Coumadin. He is to continue on Coumadin for total of 2 weeks at least. Followup with orthopedic surgery as outpatient Active Problems:   History of DVT (deep vein thrombosis): Supposed to be on Coumadin long term. Has been noncompliant with  this.    History of stroke    Protein-calorie malnutrition, severe: Patient meets criteria in the context of chronic illness. Appreciate nutrition help. On Ensure complete 3 times a day plus high-calorie protein diet plus multivitamin by mouth daily.    Tobacco abuse: Patient counseled. Declined nicotine patch.    Noncompliance: Noted above.    Acute blood loss anemia: Hemoglobin on admission at 13.6, likely hemoconcentrated down to 11.2 before surgery. On 5/19 down to 8.4. We'll recheck on day of discharge.    Elevated blood sugar: Patient did have elevated sugar of 268. A1c checked and found to be normal 5.3. Was noted to have dextrose in his IV fluids. These were discontinued.  Procedures:  Status post IM nail of right hip done 5/18  Consultations:  Dr. Mayer Camel, orthopedic surgery  Discharge Exam: Filed Vitals:   10/02/13 0608  BP: 121/74  Pulse: 89  Temp: 98.6 F (37 C)  Resp: 18    General: Alert and oriented x3, no acute distress Cardiovascular: Regular rate and rhythm, S1-S2 Respiratory: Clear to auscultation bilaterally  Discharge Instructions You were cared for by a hospitalist during your hospital stay. If you have any questions about your discharge medications or the care you received while you were in the hospital after you are discharged, you can call the unit and asked to speak with the hospitalist on call if the hospitalist that took care of you is not available. Once you are discharged, your primary care physician will handle any further medical issues. Please note that NO REFILLS for any discharge medications will be authorized once you are discharged,  as it is imperative that you return to your primary care physician (or establish a relationship with a primary care physician if you do not have one) for your aftercare needs so that they can reassess your need for medications and monitor your lab values.      Discharge Instructions   Partial weight bearing     Complete by:  As directed   % Body Weight:  50  Laterality:  right  Extremity:  Lower            Medication List         enoxaparin 30 MG/0.3ML injection  Commonly known as:  LOVENOX  Inject 0.3 mLs (30 mg total) into the skin every 12 (twelve) hours.     oxyCODONE-acetaminophen 5-325 MG per tablet  Commonly known as:  ROXICET  Take 1 tablet by mouth every 4 (four) hours as needed for severe pain.     tizanidine 2 MG capsule  Commonly known as:  ZANAFLEX  Take 2 capsules (4 mg total) by mouth 3 (three) times daily as needed for muscle spasms.     warfarin 5 MG tablet  Commonly known as:  COUMADIN  Take as directed by HHN. INR target 1.5-2.5. D/C after 2 weeks from date of surgery       No Known Allergies Follow-up Information   Follow up with Kerin Salen, MD In 10 days.   Specialty:  Orthopedic Surgery   Contact information:   [address] [place] [address] [phone]       Follow up with Kerin Salen, MD In 2 weeks.   Specialty:  Orthopedic Surgery   Contact information:   [place] [address] [phone]        The results of significant diagnostics from this hospitalization (including imaging, microbiology, ancillary and laboratory) are listed below for reference.    Significant Diagnostic Studies: Dg Hip Complete Right  09/30/2013   CLINICAL DATA:  History of fall complaining of right sided hip pain.  EXAM: RIGHT HIP - COMPLETE 2+ VIEW  COMPARISON:  09/28/2010.  FINDINGS: There is an acute comminuted intertrochanteric fracture of the right hip with varus angulation of approximately 60 degrees and proximal migration of the distal fracture fragment resulting in foreshortening. Femoral head remains located in the right acetabulum. Bony pelvis appears grossly intact. Postoperative changes of ORIF for old healed comminuted intertrochanteric fracture of the left hip also noted.  IMPRESSION: 1. Acute comminuted intertrochanteric fracture of  the right hip, as above.   Electronically Signed   By: Vinnie Langton M.D.   On: 09/30/2013 01:52   Dg Hip Operative Right  10/01/2013   CLINICAL DATA:  right hip fracture  EXAM: DG OPERATIVE RIGHT HIP  TECHNIQUE: A single spot fluoroscopic AP image of the right hip is submitted.  COMPARISON:  DG FEMUR*R* dated 09/30/2013  FINDINGS: Intra op views open reduction internal fixation right hip fracture. Intra medullary rod with cannulated screw fixation is unremarkable. Sliding lag screw is appreciated tip in the region the femoral head. There is near-anatomic alignment. Native osseous structures otherwise unremarkable.  IMPRESSION: Open reduction internal fixation right hip fracture.   Electronically Signed   By: Margaree Mackintosh M.D.   On: 10/01/2013 01:07   Dg Femur Right  09/30/2013   CLINICAL DATA:  Status post right femoral nail placement.  EXAM: RIGHT FEMUR - 2 VIEW  COMPARISON:  09/30/2013 fluoroscopy  FINDINGS: Interval intra medullary nail fixation of a recent intertrochanteric right  femur fracture. No evidence of fracture displacement compared to the intraoperative imaging. No new fracture. No unexpected radiopaque foreign body. Findings call to the PACU.  IMPRESSION: No adverse findings after open reduction and internal fixation of an intertrochanteric right femur fracture.   Electronically Signed   By: Jorje Guild M.D.   On: 09/30/2013 18:24   Dg Femur Right  09/30/2013   CLINICAL DATA:  History of trauma from a fall complaining of right-sided hip pain.  EXAM: RIGHT FEMUR - 2 VIEW  COMPARISON:  No priors.  FINDINGS: Multiple views of the right femur demonstrate an acute comminuted and angulated intertrochanteric fracture of the right hip, with approximately 60 degrees of varus angulation and proximal migration of the distal fracture fragment resulting in foreshortening in the hip. Distal aspect of the femur is otherwise intact.  IMPRESSION: 1. Acute comminuted and angulated intertrochanteric  fracture of the right hip, as above.   Electronically Signed   By: Vinnie Langton M.D.   On: 09/30/2013 01:58   Ct Head Wo Contrast  09/30/2013   CLINICAL DATA:  History of trauma from a fall.  On Coumadin.  EXAM: CT HEAD WITHOUT CONTRAST  TECHNIQUE: Contiguous axial images were obtained from the base of the skull through the vertex without intravenous contrast.  COMPARISON:  Head CT 09/28/2010  FINDINGS: Mild cerebral and cerebellar atrophy. No acute displaced skull fractures are identified. No acute intracranial abnormality. Specifically, no evidence of acute post-traumatic intracranial hemorrhage, no definite regions of acute/subacute cerebral ischemia, no focal mass, mass effect, hydrocephalus or abnormal intra or extra-axial fluid collections. The visualized paranasal sinuses and mastoids are well pneumatized, with exception of some mild multifocal mucosal thickening in the paranasal sinuses, most pronounced in the left maxillary sinus.  IMPRESSION: 1. No acute displaced skull fracture or evidence of acute intracranial trauma. 2. Mild paranasal sinus disease, as above.   Electronically Signed   By: Vinnie Langton M.D.   On: 09/30/2013 01:49   Dg Pelvis Portable  09/30/2013   CLINICAL DATA:  63 year old male status post right femur surgery. Initial encounter.  EXAM: PORTABLE PELVIS 1-2 VIEWS  COMPARISON:  Intraoperative images 15 45 hr the same day.  FINDINGS: Portable AP view of the pelvis. Bilateral femur intra medullary rods with proximal interlocking dynamic hip screws. That on the right traverses the recent intertrochanteric fracture. Visible hardware appears stable and intact.  IMPRESSION: Stable visualized proximal right femur hardware with no adverse features.   Electronically Signed   By: Lars Pinks M.D.   On: 09/30/2013 21:39   Dg Chest Port 1 View  09/30/2013   CLINICAL DATA:  pre op  EXAM: PORTABLE CHEST - 1 VIEW  COMPARISON:  None.  FINDINGS: The heart size and mediastinal contours are  within normal limits. Both lungs are clear. Degenerative changes are appreciated within the right shoulder. There is mild dextroscoliosis within the thoracic spine.  IMPRESSION: No active disease.   Electronically Signed   By: Margaree Mackintosh M.D.   On: 09/30/2013 01:51    Microbiology: Recent Results (from the past 240 hour(s))  SURGICAL PCR SCREEN     Status: None   Collection Time    09/30/13  2:07 PM      Result Value Ref Range Status   MRSA, PCR NEGATIVE  NEGATIVE Final   Staphylococcus aureus NEGATIVE  NEGATIVE Final   Comment:            The Xpert SA Assay (FDA  approved for NASAL specimens     in patients over 29 years of age),     is one component of     a comprehensive surveillance     program.  Test performance has     been validated by [employer] for patients greater     than or equal to 56 year old.     It is not intended     to diagnose infection nor to     guide or monitor treatment.     Labs: Basic Metabolic Panel:  Recent Labs Lab 09/30/13 0152 09/30/13 0641 10/01/13 0410 10/02/13 0421  NA 142 144 135* 139  K 3.4* 4.0 4.1 4.1  CL 104 105 100 100  CO2  --  25 23 30   GLUCOSE 108* 103* 268* 100*  BUN 7 9 10 9   CREATININE 1.10 0.80 0.74 0.85  CALCIUM  --  8.4 8.2* 8.6   Liver Function Tests:  Recent Labs Lab 09/30/13 0641  AST 35  ALT 21  ALKPHOS 72  BILITOT 0.5  PROT 6.5  ALBUMIN 3.5   No results found for this basename: LIPASE, AMYLASE,  in the last 168 hours No results found for this basename: AMMONIA,  in the last 168 hours CBC:  Recent Labs Lab 09/30/13 0140 09/30/13 0152 09/30/13 0641 10/01/13 0410 10/02/13 0421  WBC 7.5  --  6.4 6.2 7.4  NEUTROABS 5.2  --  4.2  --   --   HGB 13.0 13.6 11.2* 8.4* 8.9*  HCT 36.6* 40.0 32.0* 23.9* 25.9*  MCV 102.5*  --  102.6* 103.0* 104.0*  PLT 184  --  156 124* 150   Cardiac Enzymes: No results found for this basename: CKTOTAL, CKMB, CKMBINDEX, TROPONINI,  in the last 168  hours BNP: BNP (last 3 results) No results found for this basename: PROBNP,  in the last 8760 hours CBG:  Recent Labs Lab 10/01/13 0759 10/01/13 1210 10/01/13 1644 10/01/13 2049 10/02/13 0745  GLUCAP 167* 94 104* 125* 92       Signed:  Reika Callanan M Sherron Mummert  [facility] 10/02/2013, 11:38 AM

## 2013-10-01 NOTE — Progress Notes (Signed)
ANTICOAGULATION CONSULT NOTE - Initial Consult  Pharmacy Consult for Warfarin Indication: VTE prophylaxis  No Known Allergies  Patient Measurements: Height: 6' (182.9 cm) Weight: 119 lb 1.6 oz (54.023 kg) IBW/kg (Calculated) : 77.6  Vital Signs: Temp: 98.7 F (37.1 C) (05/19 0200) Temp src: Oral (05/19 0200) BP: 108/63 mmHg (05/19 0200) Pulse Rate: 90 (05/19 0200)  Labs:  Recent Labs  09/30/13 0140 09/30/13 0152 09/30/13 0641 10/01/13 0410  HGB 13.0 13.6 11.2* 8.4*  HCT 36.6* 40.0 32.0* 23.9*  PLT 184  --  156 124*  LABPROT 12.2  --   --  13.4  INR 0.92  --   --  1.04  CREATININE  --  1.10 0.80 0.74    Estimated Creatinine Clearance: 72.2 ml/min (by C-G formula based on Cr of 0.74).   Medical History: Past Medical History  Diagnosis Date  . Stroke     Medications:  Scheduled:  . docusate sodium  100 mg Oral BID  . enoxaparin (LOVENOX) injection  30 mg Subcutaneous Q12H  . warfarin  7.5 mg Oral ONCE-1800  . Warfarin - Pharmacist Dosing Inpatient   Does not apply q1800   Infusions:  . dextrose 5 % and 0.45 % NaCl with KCl 20 mEq/L 100 mL/hr at 09/30/13 2004   PRN: acetaminophen, acetaminophen, bisacodyl, HYDROcodone-acetaminophen, menthol-cetylpyridinium, methocarbamol (ROBAXIN) IV, methocarbamol, metoCLOPramide (REGLAN) injection, metoCLOPramide, morphine injection, ondansetron (ZOFRAN) IV, ondansetron, phenol, senna-docusate  Assessment: 63 yo male with PMHx stroke and VTE, noncompliant with anticoagulation, also polycythemia, prior left hip fx surgery repair, and alcohol and tobacco abuse, admitted 5/18 for right hip fracture, s/p ORIF. Pt unfamiliar with PTA medications. Pt on Lovenox 30mg  Q12H to bridge to warfarin.  5/19 INR: 1.04  Goal of Therapy:  INR 2-3   Plan:   Repeat Warfarin 7.5mg  PO x 1 tonight  Daily PT/INR  Provide warfarin education prior to discharge  Kizzie Furnish, PharmD Pager: [phone] 10/01/2013 7:43 AM

## 2013-10-01 NOTE — Progress Notes (Addendum)
Clinical Social Work Department CLINICAL SOCIAL WORK PLACEMENT NOTE 10/01/2013  Patient:  [name], [name]  Account Number:  [id number] [place] date:  09/29/2013  Clinical Social Worker:  Sindy Messing, LCSW  Date/time:  10/01/2013 01:00 PM  Clinical Social Work is seeking post-discharge placement for this patient at the following level of care:   [place]   (*CSW will update this form in Epic as items are completed)   10/01/2013  Patient/family provided with [facility] Department of Clinical Social Work's list of facilities offering this level of care within the geographic area requested by the patient (or if unable, by the patient's family).  10/01/2013  Patient/family informed of their freedom to choose among providers that offer the needed level of care, that participate in Medicare, Medicaid or managed care program needed by the patient, have an available bed and are willing to accept the patient.  10/01/2013  Patient/family informed of [facility]' ownership interest in [facility], as well as of the fact that they are under no obligation to receive care at this facility.  PASARR submitted to EDS on 10/01/2013 PASARR number received from EDS on 10/01/2013  FL2 transmitted to all facilities in geographic area requested by pt/family on  10/01/2013 FL2 transmitted to all facilities within larger geographic area on   Patient informed that his/her managed care company has contracts with or will negotiate with  certain facilities, including the following:     Patient/family informed of bed offers received:   Patient chooses bed at  Physician recommends and patient chooses bed at    Patient to be transferred to  on   Patient to be transferred to facility by   The following physician request were entered in Epic:   Additional Comments: 5/20-Patient now declining SNF and wants to DC with [place].

## 2013-10-01 NOTE — Evaluation (Signed)
Physical Therapy Evaluation Patient Details Name: [name] MRN: [id number] DOB: 1951/02/27 Today's Date: 10/01/2013   History of Present Illness  63 y.o. male with h/o CVA, L hip fx admitted with R hip fx, s/p IM nail with posterior precautions per ortho progress note.  Clinical Impression  *Pt admitted with **R hip fx, s/p IM nail*. Pt currently with functional limitations due to the deficits listed below (see PT Problem List).  Pt will benefit from skilled PT to increase their independence and safety with mobility to allow discharge to the venue listed below.   **    Follow Up Recommendations Home health PT    Equipment Recommendations  3in1 (PT)    Recommendations for Other Services OT consult     Precautions / Restrictions Precautions Precautions: Posterior Hip Precaution Booklet Issued: Yes (comment) Precaution Comments: sign in room Restrictions Weight Bearing Restrictions: Yes RLE Weight Bearing: Partial weight bearing RLE Partial Weight Bearing Percentage or Pounds: 50%      Mobility  Bed Mobility Overal bed mobility: Needs Assistance Bed Mobility: Supine to Sit     Supine to sit: Min assist     General bed mobility comments: Min A to support RLE  Transfers Overall transfer level: Needs assistance Equipment used: Rolling walker (2 wheeled) Transfers: Sit to/from Stand Sit to Stand: Min guard         General transfer comment: cues for hand placement, min/guard for safety  Ambulation/Gait Ambulation/Gait assistance: Min guard Ambulation Distance (Feet): 90 Feet Assistive device: Rolling walker (2 wheeled) Gait Pattern/deviations: Step-to pattern   Gait velocity interpretation: Below normal speed for age/gender General Gait Details: cues for sequencing, pt did TTWB instead of 50% PWB due to pain  Stairs            Wheelchair Mobility    Modified Rankin (Stroke Patients Only)       Balance Overall balance assessment: Needs  assistance   Sitting balance-Leahy Scale: Good       Standing balance-Leahy Scale: Poor                               Pertinent Vitals/Pain **When asked to rate pain pt stated, "it just hurts" R hip Premedicated, ice applied*    Home Living Family/patient expects to be discharged to:: Private residence Living Arrangements: Alone (daughter coming from [place] to stay with pt, girlfriend also available) Available Help at Discharge: Available 24 hours/day   Home Access: Ramped entrance;Stairs to enter Entrance Stairs-Rails: Can reach both;Left;Right Entrance Stairs-Number of Steps: 3 STE in front with B rails, ramp in back, pt uses front steps   Home Equipment: Kasandra Knudsen - quad;Walker - 2 wheels;Wheelchair - manual;Tub bench;Grab bars - tub/shower;Grab bars - toilet Additional Comments: tub shower    Prior Function Level of Independence: Independent with assistive device(s)               Hand Dominance        Extremity/Trunk Assessment   Upper Extremity Assessment: Overall WFL for tasks assessed           Lower Extremity Assessment: RLE deficits/detail;LLE deficits/detail RLE Deficits / Details: hip ABD AAROM to 20*, hip flexion AAROM to 45*, limited by pain, ankle DF 5/5 LLE Deficits / Details: pt reports chronic decreased sensation to light touch in B feet and hands     Communication   Communication: No difficulties  Cognition Arousal/Alertness: Awake/alert Behavior During Therapy: [facility] for  tasks assessed/performed Overall Cognitive Status: Within Functional Limits for tasks assessed                      General Comments      Exercises Total Joint Exercises Ankle Circles/Pumps: AROM;Both;10 reps;Supine Heel Slides: AAROM;Right;10 reps;Supine Hip ABduction/ADduction: AAROM;Right;10 reps;Supine      Assessment/Plan    PT Assessment Patient needs continued PT services  PT Diagnosis Difficulty walking;Acute pain   PT Problem List  Decreased strength;Decreased range of motion;Decreased activity tolerance;Decreased balance;Pain;Decreased mobility;Decreased knowledge of precautions  PT Treatment Interventions DME instruction;Gait training;Functional mobility training;Therapeutic activities   PT Goals (Current goals can be found in the Care Plan section) Acute Rehab PT Goals Patient Stated Goal: to go fishing and do whatever I want PT Goal Formulation: With patient Time For Goal Achievement: 10/15/13 Potential to Achieve Goals: Good    Frequency Min 5X/week   Barriers to discharge        Co-evaluation               End of Session Equipment Utilized During Treatment: Gait belt Activity Tolerance: Patient tolerated treatment well Patient left: in chair;with call bell/phone within reach           Time: 0945-1018 PT Time Calculation (min): 33 min   Charges:   PT Evaluation $Initial PT Evaluation Tier I: 1 Procedure PT Treatments $Gait Training: 8-22 mins $Therapeutic Exercise: 8-22 mins   PT G CodesLucile Crater 10/01/2013, 10:32 AM [phone]

## 2013-10-02 ENCOUNTER — Telehealth: Payer: Self-pay | Admitting: Internal Medicine

## 2013-10-02 LAB — PROTIME-INR
INR: 1.29 (ref 0.00–1.49)
PROTHROMBIN TIME: 15.8 s — AB (ref 11.6–15.2)

## 2013-10-02 LAB — BASIC METABOLIC PANEL
BUN: 9 mg/dL (ref 6–23)
CALCIUM: 8.6 mg/dL (ref 8.4–10.5)
CO2: 30 meq/L (ref 19–32)
CREATININE: 0.85 mg/dL (ref 0.50–1.35)
Chloride: 100 mEq/L (ref 96–112)
GFR calc Af Amer: >90 mL/min (ref >90)
Glucose, Bld: 100 mg/dL — ABNORMAL HIGH (ref 70–99)
Potassium: 4.1 mEq/L (ref 3.7–5.3)
Sodium: 139 mEq/L (ref 137–147)

## 2013-10-02 LAB — URINE CULTURE
CULTURE: NO GROWTH
Colony Count: NO GROWTH

## 2013-10-02 LAB — CBC
HCT: 25.9 % — ABNORMAL LOW (ref 39.0–52.0)
Hemoglobin: 8.9 g/dL — ABNORMAL LOW (ref 13.0–17.0)
MCH: 35.7 pg — AB (ref 26.0–34.0)
MCHC: 34.4 g/dL (ref 30.0–36.0)
MCV: 104 fL — ABNORMAL HIGH (ref 78.0–100.0)
PLATELETS: 150 10*3/uL (ref 150–400)
RBC: 2.49 MIL/uL — ABNORMAL LOW (ref 4.22–5.81)
RDW: 13.4 % (ref 11.5–15.5)
WBC: 7.4 10*3/uL (ref 4.0–10.5)

## 2013-10-02 LAB — GLUCOSE, CAPILLARY: Glucose-Capillary: 92 mg/dL (ref 70–99)

## 2013-10-02 MED ORDER — WARFARIN SODIUM 10 MG PO TABS
10.0000 mg | ORAL_TABLET | Freq: Once | ORAL | Status: DC
  Filled 2013-10-02: qty 1

## 2013-10-02 MED ORDER — WARFARIN SODIUM 5 MG PO TABS
5.0000 mg | ORAL_TABLET | Freq: Once | ORAL | Status: DC
  Filled 2013-10-02: qty 1

## 2013-10-02 MED ORDER — ENOXAPARIN SODIUM 30 MG/0.3ML ~~LOC~~ SOLN: 30.0000 mg | Freq: Two times a day (BID) | SUBCUTANEOUS | Status: DC

## 2013-10-02 MED ORDER — WARFARIN SODIUM 5 MG PO TABS: ORAL_TABLET | ORAL | Status: AC

## 2013-10-02 NOTE — Progress Notes (Addendum)
Patient discharge home with [facility] and girlfriend, alert and oriented, discharge instructions given, patient and girlfriend verbalize understanding of discharge instructions given, My Chart access declined at this time, patient in stable condition at this time

## 2013-10-02 NOTE — Telephone Encounter (Signed)
Dr. Cruzita Lederer called in np referral.  Pt to see Dr. Julien Nordmann on 10/09/13@11 :00 Dx-dvt Mailed np packet

## 2013-10-02 NOTE — Progress Notes (Signed)
PATIENT ID: [name]  MRN: [id number]  DOB/AGE:  1950-06-05 / 63 y.o.  2 Days Post-Op Procedure(s) (LRB): INTRAMEDULLARY (IM) NAIL FEMORAL (Right)    PROGRESS NOTE Subjective: Patient is alert, oriented,no Nausea, no Vomiting, yes passing gas, no Bowel Movement. Taking PO well. Denies SOB, Chest or Calf Pain. Using Incentive Spirometer, PAS in place. Ambulate 50% WB RLE Patient reports pain as 3 on 0-10 scale  .    Objective: Vital signs in last 24 hours: Filed Vitals:   10/01/13 1442 10/01/13 1812 10/01/13 2130 10/02/13 0608  BP: 93/52 101/66 98/61 121/74  Pulse: 83 86 92 89  Temp: 98.6 F (37 C) 98.5 F (36.9 C) 98.5 F (36.9 C) 98.6 F (37 C)  TempSrc: Oral Oral Oral Oral  Resp: 16 18 18 18   Height:      Weight:      SpO2: 99% 96%  100%      Intake/Output from previous day: I/O last 3 completed shifts: In: 1593.3 [P.O.:600; I.V.:993.3] Out: 900 [Urine:900]   Intake/Output this shift: Total I/O In: -  Out: 100 [Urine:100]   LABORATORY DATA:  Recent Labs  10/01/13 0410  10/01/13 1210 10/01/13 1644 10/01/13 2049 10/02/13 0421  WBC 6.2  --   --   --   --  7.4  HGB 8.4*  --   --   --   --  8.9*  HCT 23.9*  --   --   --   --  25.9*  PLT 124*  --   --   --   --  150  NA 135*  --   --   --   --  139  K 4.1  --   --   --   --  4.1  CL 100  --   --   --   --  100  CO2 23  --   --   --   --  30  BUN 10  --   --   --   --  9  CREATININE 0.74  --   --   --   --  0.85  GLUCOSE 268*  --   --   --   --  100*  GLUCAP  --   < > 94 104* 125*  --   INR 1.04  --   --   --   --  1.29  CALCIUM 8.2*  --   --   --   --  8.6  < > = values in this interval not displayed.  Examination: Neurologically intact Neurovascular intact Sensation intact distally Intact pulses distally Dorsiflexion/Plantar flexion intact Incision: dressing C/D/I No cellulitis present Compartment soft} XR AP&Lat of hip shows well placed\fixed THA  Assessment:   2 Days Post-Op Procedure(s)  (LRB): INTRAMEDULLARY (IM) NAIL FEMORAL (Right) ADDITIONAL DIAGNOSIS:  History of DVTs and strokes   Plan: PT/OT 50% weight bearing, THA  posterior precautions  DVT Prophylaxis: SCDx72 hrs, Lovenox 30 mg subcutaneous every 12 hours x5 days. New medication: Coumadin 5 mg each bedtime x 2 weeks per hospitalist   DISCHARGE PLAN: Home, per patient, though hospitalist and social worker recommend SNF  DISCHARGE NEEDS: HHPT, HHRN, Walker and 3-in-1 comode seat

## 2013-10-02 NOTE — Telephone Encounter (Signed)
Pt aware of appt. With Dr. Julien Nordmann

## 2013-10-02 NOTE — Care Management Note (Addendum)
    Page 1 of 2   10/02/2013     1:28:16 PM CARE MANAGEMENT NOTE 10/02/2013  Patient:  [name], [name]   Account Number:  [id number]  Date Initiated:  10/01/2013  Documentation initiated by:  [facility]  Subjective/Objective Assessment:   63 year old male admitted with hip fracture and underwent Open reduction internal fixation left hip intertrochanteric fracture.     Action/Plan:   From home with son and daughter in law. PT recommending [facility] services at d/c.   Anticipated DC Date:  10/02/2013   Anticipated DC Plan:  [place]  In-house referral  Clinical Social Worker      DC Planning Services  CM consult      Choice offered to / List presented to:  C-1 Patient   DME arranged  [place]      DME agency  [place] arranged  [place].   Status of service:  Completed, signed off Medicare Important Message given?  YES (If response is "NO", the following Medicare IM given date fields will be blank) Date Medicare IM given:  10/01/2013 Date Additional Medicare IM given:    Discharge Disposition:  [place]  Per UR Regulation:  Reviewed for med. necessity/level of care/duration of stay  If discussed at [place] of Stay Meetings, dates discussed:    Comments:  10/02/13 Allene Dillon RN BSN [phone] Pt wasn to go home with [facility] services. He has chosen [facility] to provide the services. Referral has been made to [place]. Bedside commonde and rolling walker also ordered for pt.  10/01/13 CM met with pt who is amenable to SNF for rehab. CSW aware.  No other CM needs were communicated.  Mariane Masters, BSN, CM [phone].

## 2013-10-02 NOTE — Progress Notes (Signed)
ANTICOAGULATION CONSULT NOTE - Initial Consult  Pharmacy Consult for Warfarin Indication: VTE prophylaxis  No Known Allergies  Patient Measurements: Height: 6' (182.9 cm) Weight: 119 lb 1.6 oz (54.023 kg) IBW/kg (Calculated) : 77.6  Vital Signs: Temp: 98.6 F (37 C) (05/20 0608) Temp src: Oral (05/20 0608) BP: 121/74 mmHg (05/20 0608) Pulse Rate: 89 (05/20 0608)  Labs:  Recent Labs  09/30/13 0140  09/30/13 0641 10/01/13 0410 10/02/13 0421  HGB 13.0  < > 11.2* 8.4* 8.9*  HCT 36.6*  < > 32.0* 23.9* 25.9*  PLT 184  --  156 124* 150  LABPROT 12.2  --   --  13.4 15.8*  INR 0.92  --   --  1.04 1.29  CREATININE  --   < > 0.80 0.74 0.85  < > = values in this interval not displayed.  Estimated Creatinine Clearance: 67.9 ml/min (by C-G formula based on Cr of 0.85).   Medical History: Past Medical History  Diagnosis Date  . Stroke     Medications:  Scheduled:  . docusate sodium  100 mg Oral BID  . enoxaparin (LOVENOX) injection  30 mg Subcutaneous Q12H  . insulin aspart  0-5 Units Subcutaneous QHS  . insulin aspart  0-9 Units Subcutaneous TID WC  . warfarin  5 mg Oral ONCE-1800  . Warfarin - Pharmacist Dosing Inpatient   Does not apply q1800   Infusions:    PRN: acetaminophen, acetaminophen, bisacodyl, HYDROcodone-acetaminophen, menthol-cetylpyridinium, methocarbamol (ROBAXIN) IV, methocarbamol, metoCLOPramide (REGLAN) injection, metoCLOPramide, morphine injection, ondansetron (ZOFRAN) IV, ondansetron, phenol, senna-docusate  Assessment: 63 yo male with PMHx stroke and VTE, noncompliant with anticoagulation, also polycythemia, prior left hip fx surgery repair, and alcohol and tobacco abuse, admitted 5/18 for right hip fracture, s/p ORIF. Pt unfamiliar with PTA medications. Pt on Lovenox 30mg  Q12H to bridge to warfarin.  5/19 INR: 1.29  Goal of Therapy:  INR 1.5-2.5   Plan:   Agree with plan for 5mg  daily. Give 5mg  dose tonight if not discharged prior to  1800.  Daily PT/INR  Kizzie Furnish, PharmD Pager: [phone] 10/02/2013 10:07 AM

## 2013-10-02 NOTE — Telephone Encounter (Signed)
C/D 10/02/13 for appt. 10/09/13

## 2013-10-02 NOTE — Progress Notes (Signed)
Clinical Social Work  CSW met with patient at bedside. Patient packing his bags and reports he wants to leave. Patient reports his dtr is coming into town and he needs to get his house ready. CSW inquired about patient changing his mind about SNF placement. Patient states that fiance works at [employer] and his plan is to be admitted there on Friday or Saturday. CSW suggested that patient DC from the hospital to SNF and then he is able to leave SNF in order to complete tasks. Patient adamantly refusing this plan and reports he is going home today. Patient reports he has already called family who will transport him home. CSW explained that patient has 30 days from DC in order to admit to SNF with Medicare coverage. CSW explained that CSW could contact [place] and alert them of patient's plan in order to make admission easier. Patient refusing CSW assistance and reports that his family will take care of all plans. Patient reports he does not need CSW assistance and wants [facility] and [facility] set up prior to DC.  CSW alert RN, [place], and CM of patient's decision. Despite talking with CSW about safety concerns with returning home, patient still decided to return home.  CSW is signing off but available if needed.  [place], [place] [phone]

## 2013-10-09 ENCOUNTER — Ambulatory Visit: Payer: Medicare Other

## 2013-10-09 ENCOUNTER — Other Ambulatory Visit: Payer: Self-pay | Admitting: Medical Oncology

## 2013-10-09 ENCOUNTER — Other Ambulatory Visit: Payer: Medicare Other

## 2013-10-09 ENCOUNTER — Ambulatory Visit: Payer: Medicare Other | Admitting: Internal Medicine

## 2013-10-09 ENCOUNTER — Other Ambulatory Visit: Payer: Self-pay | Admitting: Internal Medicine

## 2013-10-09 DIAGNOSIS — Z86718 Personal history of other venous thrombosis and embolism: Secondary | ICD-10-CM

## 2013-10-21 ENCOUNTER — Telehealth: Payer: Self-pay | Admitting: Hematology and Oncology

## 2013-10-21 NOTE — Telephone Encounter (Signed)
S/W PATIENT AND GAVE NP APPT FOR 06/25 @ 10 W/DR. CHISM.  REFERRING DR. [place] DX- DVT WELCOME PACKET MAILED.

## 2013-11-07 ENCOUNTER — Encounter: Payer: Self-pay | Admitting: Hematology and Oncology

## 2013-11-07 ENCOUNTER — Ambulatory Visit (HOSPITAL_BASED_OUTPATIENT_CLINIC_OR_DEPARTMENT_OTHER): Payer: Self-pay | Admitting: Hematology and Oncology

## 2013-11-07 ENCOUNTER — Ambulatory Visit: Payer: Medicare Other

## 2013-11-07 VITALS — BP 94/57 | HR 120 | Temp 97.8°F | Resp 20 | Ht 72.0 in | Wt 105.9 lb

## 2013-11-07 DIAGNOSIS — D62 Acute posthemorrhagic anemia: Secondary | ICD-10-CM

## 2013-11-07 DIAGNOSIS — Z8673 Personal history of transient ischemic attack (TIA), and cerebral infarction without residual deficits: Secondary | ICD-10-CM

## 2013-11-07 DIAGNOSIS — Z91199 Patient's noncompliance with other medical treatment and regimen due to unspecified reason: Secondary | ICD-10-CM

## 2013-11-07 DIAGNOSIS — Z9119 Patient's noncompliance with other medical treatment and regimen: Secondary | ICD-10-CM

## 2013-11-07 DIAGNOSIS — F17200 Nicotine dependence, unspecified, uncomplicated: Secondary | ICD-10-CM

## 2013-11-07 DIAGNOSIS — Z720 Tobacco use: Secondary | ICD-10-CM

## 2013-11-07 NOTE — Progress Notes (Signed)
[facility] CONSULT NOTE  Patient Care Team: Ricke Hey, MD as PCP - General (Family Medicine)  CHIEF COMPLAINTS/PURPOSE OF CONSULTATION:  History of dural venous thrombosis causing left-sided stroke, on chronic anticoagulation therapy  HISTORY OF PRESENTING ILLNESS:  [name] 63 y.o. male is here because of prior diagnosis of stroke. The patient himself could not remember the details and thought he had stroke more than 10 years ago. MRI angiogram dated November 2005 showed partial dural venous thrombosis affecting the superior sagittal sinus.  Although he carries diagnosis of DVT in his chart, there were nothing to prove that. I believe what he meant by DVT was the dural venous thrombosis. He denies lower extremity swelling, warmth, tenderness & erythema.  He denies recent chest pain on exertion, shortness of breath on minimal exertion, hemoptysis, or palpitation. Recently, he fell and broke his hip requiring surgery. He was noted to be anemic postop. The patient is noncompliant with anticoagulation therapy and has not followup with any providers for over a year with no INR monitoring prior to admission to the hospital.  MEDICAL HISTORY:  Past Medical History  Diagnosis Date  . Stroke     SURGICAL HISTORY: Past Surgical History  Procedure Laterality Date  . Total hip arthroplasty    . Femur im nail Right 09/30/2013    Procedure: INTRAMEDULLARY (IM) NAIL FEMORAL;  Surgeon: Kerin Salen, MD;  Location: [facility];  Service: Orthopedics;  Laterality: Right;    SOCIAL HISTORY: History   Social History  . Marital Status: Single    Spouse Name: N/A    Number of Children: N/A  . Years of Education: N/A   Occupational History  . Not on file.   Social History Main Topics  . Smoking status: Current Every Day Smoker -- 0.50 packs/day for 50 years  . Smokeless tobacco: Never Used  . Alcohol Use: Yes  . Drug Use: No  . Sexual Activity: Not on file   Other  Topics Concern  . Not on file   Social History Narrative  . No narrative on file    FAMILY HISTORY: History reviewed. No pertinent family history.  ALLERGIES:  has No Known Allergies.  MEDICATIONS:  Current Outpatient Prescriptions  Medication Sig Dispense Refill  . warfarin (COUMADIN) 5 MG tablet Take as directed by HHN. INR target 1.5-2.5. D/C after 2 weeks from date of surgery  30 tablet  0   No current facility-administered medications for this visit.    REVIEW OF SYSTEMS:   Constitutional: Denies fevers, chills or abnormal night sweats Eyes: Denies blurriness of vision, double vision or watery eyes Ears, nose, mouth, throat, and face: Denies mucositis or sore throat Respiratory: Denies cough, dyspnea or wheezes Cardiovascular: Denies palpitation, chest discomfort or lower extremity swelling Gastrointestinal:  Denies nausea, heartburn or change in bowel habits Skin: Denies abnormal skin rashes Lymphatics: Denies new lymphadenopathy or easy bruising Neurological:Denies numbness, tingling or new weaknesses Behavioral/Psych: Mood is stable, no new changes  All other systems were reviewed with the patient and are negative.  PHYSICAL EXAMINATION: ECOG PERFORMANCE STATUS: 2 - Symptomatic, <50% confined to bed  Filed Vitals:   11/07/13 1048  BP: 94/57  Pulse: 120  Temp: 97.8 F (36.6 C)  Resp: 20   Filed Weights   11/07/13 1048  Weight: 105 lb 14.4 oz (48.036 kg)    GENERAL:alert, no distress and comfortable. He looks thin and cachectic SKIN: skin color, texture, turgor are normal, no rashes or significant lesions  EYES: normal, conjunctiva are pink and non-injected, sclera clear OROPHARYNX:no exudate, no erythema and lips, buccal mucosa, and tongue normal . Poor dentition is noted NECK: supple, thyroid normal size, non-tender, without nodularity LYMPH:  no palpable lymphadenopathy in the cervical, axillary or inguinal LUNGS: clear to auscultation and percussion  with normal breathing effort HEART: regular rate & rhythm and no murmurs and no lower extremity edema ABDOMEN:abdomen soft, non-tender and normal bowel sounds Musculoskeletal:no cyanosis of digits and no clubbing  PSYCH: alert & oriented x 3 with fluent speech NEURO: no focal motor/sensory deficits  LABORATORY DATA:  I have reviewed the data as listed Recent Results (from the past 2160 hour(s))  CBC WITH DIFFERENTIAL     Status: Abnormal   Collection Time    09/30/13  1:40 AM      Result Value Ref Range   WBC 7.5  4.0 - 10.5 K/uL   RBC 3.57 (*) 4.22 - 5.81 MIL/uL   Hemoglobin 13.0  13.0 - 17.0 g/dL   HCT 36.6 (*) 39.0 - 52.0 %   MCV 102.5 (*) 78.0 - 100.0 fL   MCH 36.4 (*) 26.0 - 34.0 pg   MCHC 35.5  30.0 - 36.0 g/dL   RDW 13.6  11.5 - 15.5 %   Platelets 184  150 - 400 K/uL   Neutrophils Relative % 70  43 - 77 %   Neutro Abs 5.2  1.7 - 7.7 K/uL   Lymphocytes Relative 22  12 - 46 %   Lymphs Abs 1.6  0.7 - 4.0 K/uL   Monocytes Relative 8  3 - 12 %   Monocytes Absolute 0.6  0.1 - 1.0 K/uL   Eosinophils Relative 0  0 - 5 %   Eosinophils Absolute 0.0  0.0 - 0.7 K/uL   Basophils Relative 0  0 - 1 %   Basophils Absolute 0.0  0.0 - 0.1 K/uL  PROTIME-INR     Status: None   Collection Time    09/30/13  1:40 AM      Result Value Ref Range   Prothrombin Time 12.2  11.6 - 15.2 seconds   INR 0.92  0.00 - 1.49  I-STAT CHEM 8, ED     Status: Abnormal   Collection Time    09/30/13  1:52 AM      Result Value Ref Range   Sodium 142  137 - 147 mEq/L   Potassium 3.4 (*) 3.7 - 5.3 mEq/L   Chloride 104  96 - 112 mEq/L   BUN 7  6 - 23 mg/dL   Creatinine, Ser 1.10  0.50 - 1.35 mg/dL   Glucose, Bld 108 (*) 70 - 99 mg/dL   Calcium, Ion 1.11 (*) 1.13 - 1.30 mmol/L   TCO2 25  0 - 100 mmol/L   Hemoglobin 13.6  13.0 - 17.0 g/dL   HCT 40.0  39.0 - 52.0 %  ABO/RH     Status: None   Collection Time    09/30/13  6:00 AM      Result Value Ref Range   ABO/RH(D) A POS    COMPREHENSIVE METABOLIC  PANEL     Status: Abnormal   Collection Time    09/30/13  6:41 AM      Result Value Ref Range   Sodium 144  137 - 147 mEq/L   Potassium 4.0  3.7 - 5.3 mEq/L   Chloride 105  96 - 112 mEq/L   CO2 25  19 - 32 mEq/L  Glucose, Bld 103 (*) 70 - 99 mg/dL   BUN 9  6 - 23 mg/dL   Creatinine, Ser 0.80  0.50 - 1.35 mg/dL   Calcium 8.4  8.4 - 10.5 mg/dL   Total Protein 6.5  6.0 - 8.3 g/dL   Albumin 3.5  3.5 - 5.2 g/dL   AST 35  0 - 37 U/L   ALT 21  0 - 53 U/L   Alkaline Phosphatase 72  39 - 117 U/L   Total Bilirubin 0.5  0.3 - 1.2 mg/dL   GFR calc non Af Amer >90  >90 mL/min   GFR calc Af Amer >90  >90 mL/min   Comment: (NOTE)     The eGFR has been calculated using the CKD EPI equation.     This calculation has not been validated in all clinical situations.     eGFR's persistently <90 mL/min signify possible Chronic Kidney     Disease.  CBC WITH DIFFERENTIAL     Status: Abnormal   Collection Time    09/30/13  6:41 AM      Result Value Ref Range   WBC 6.4  4.0 - 10.5 K/uL   RBC 3.12 (*) 4.22 - 5.81 MIL/uL   Hemoglobin 11.2 (*) 13.0 - 17.0 g/dL   Comment: REPEATED TO VERIFY     DELTA CHECK NOTED   HCT 32.0 (*) 39.0 - 52.0 %   MCV 102.6 (*) 78.0 - 100.0 fL   MCH 35.9 (*) 26.0 - 34.0 pg   MCHC 35.0  30.0 - 36.0 g/dL   RDW 13.7  11.5 - 15.5 %   Platelets 156  150 - 400 K/uL   Neutrophils Relative % 65  43 - 77 %   Neutro Abs 4.2  1.7 - 7.7 K/uL   Lymphocytes Relative 26  12 - 46 %   Lymphs Abs 1.7  0.7 - 4.0 K/uL   Monocytes Relative 9  3 - 12 %   Monocytes Absolute 0.6  0.1 - 1.0 K/uL   Eosinophils Relative 0  0 - 5 %   Eosinophils Absolute 0.0  0.0 - 0.7 K/uL   Basophils Relative 0  0 - 1 %   Basophils Absolute 0.0  0.0 - 0.1 K/uL  TSH     Status: None   Collection Time    09/30/13  6:41 AM      Result Value Ref Range   TSH 0.919  0.350 - 4.500 uIU/mL   Comment: Please note change in reference range.     Performed at [facility]  VITAMIN D 25 HYDROXY     Status:  Abnormal   Collection Time    09/30/13  6:41 AM      Result Value Ref Range   Vit D, 25-Hydroxy 16 (*) 30 - 89 ng/mL   Comment: (NOTE)     This assay accurately quantifies Vitamin D, which is the sum of the     25-Hydroxy forms of Vitamin D2 and D3.  Studies have shown that the     optimum concentration of 25-Hydroxy Vitamin D is 30 ng/mL or higher.      Concentrations of Vitamin D between 20 and 29 ng/mL are considered to     be insufficient and concentrations less than 20 ng/mL are considered     to be deficient for Vitamin D.     Performed at [facility]     Status: None  Collection Time    09/30/13  6:41 AM      Result Value Ref Range   ABO/RH(D) A POS     Antibody Screen NEG     Sample Expiration 10/03/2013    GLUCOSE, CAPILLARY     Status: Abnormal   Collection Time    09/30/13  7:10 AM      Result Value Ref Range   Glucose-Capillary 100 (*) 70 - 99 mg/dL   Comment 1 Notify RN    GLUCOSE, CAPILLARY     Status: None   Collection Time    09/30/13 11:39 AM      Result Value Ref Range   Glucose-Capillary 81  70 - 99 mg/dL   Comment 1 Notify RN    SURGICAL PCR SCREEN     Status: None   Collection Time    09/30/13  2:07 PM      Result Value Ref Range   MRSA, PCR NEGATIVE  NEGATIVE   Staphylococcus aureus NEGATIVE  NEGATIVE   Comment:            The Xpert SA Assay (FDA     approved for NASAL specimens     in patients over 39 years of age),     is one component of     a comprehensive surveillance     program.  Test performance has     been validated by [employer] for patients greater     than or equal to 2 year old.     It is not intended     to diagnose infection nor to     guide or monitor treatment.  GLUCOSE, CAPILLARY     Status: Abnormal   Collection Time    09/30/13  8:06 PM      Result Value Ref Range   Glucose-Capillary 107 (*) 70 - 99 mg/dL   Comment 1 Notify RN    GLUCOSE, CAPILLARY     Status: Abnormal   Collection  Time    09/30/13 11:49 PM      Result Value Ref Range   Glucose-Capillary 256 (*) 70 - 99 mg/dL   Comment 1 Notify RN    CBC     Status: Abnormal   Collection Time    10/01/13  4:10 AM      Result Value Ref Range   WBC 6.2  4.0 - 10.5 K/uL   RBC 2.32 (*) 4.22 - 5.81 MIL/uL   Hemoglobin 8.4 (*) 13.0 - 17.0 g/dL   Comment: REPEATED TO VERIFY     DELTA CHECK NOTED   HCT 23.9 (*) 39.0 - 52.0 %   MCV 103.0 (*) 78.0 - 100.0 fL   MCH 36.2 (*) 26.0 - 34.0 pg   MCHC 35.1  30.0 - 36.0 g/dL   RDW 13.4  11.5 - 15.5 %   Platelets 124 (*) 150 - 400 K/uL  BASIC METABOLIC PANEL     Status: Abnormal   Collection Time    10/01/13  4:10 AM      Result Value Ref Range   Sodium 135 (*) 137 - 147 mEq/L   Comment: DELTA CHECK NOTED     REPEATED TO VERIFY   Potassium 4.1  3.7 - 5.3 mEq/L   Chloride 100  96 - 112 mEq/L   CO2 23  19 - 32 mEq/L   Glucose, Bld 268 (*) 70 - 99 mg/dL   BUN 10  6 - 23 mg/dL  Creatinine, Ser 0.74  0.50 - 1.35 mg/dL   Calcium 8.2 (*) 8.4 - 10.5 mg/dL   GFR calc non Af Amer >90  >90 mL/min   GFR calc Af Amer >90  >90 mL/min   Comment: (NOTE)     The eGFR has been calculated using the CKD EPI equation.     This calculation has not been validated in all clinical situations.     eGFR's persistently <90 mL/min signify possible Chronic Kidney     Disease.  PROTIME-INR     Status: None   Collection Time    10/01/13  4:10 AM      Result Value Ref Range   Prothrombin Time 13.4  11.6 - 15.2 seconds   INR 1.04  0.00 - 1.49  URINALYSIS, ROUTINE W REFLEX MICROSCOPIC     Status: Abnormal   Collection Time    10/01/13  5:17 AM      Result Value Ref Range   Color, Urine YELLOW  YELLOW   APPearance CLEAR  CLEAR   Specific Gravity, Urine 1.023  1.005 - 1.030   pH 6.0  5.0 - 8.0   Glucose, UA 500 (*) NEGATIVE mg/dL   Hgb urine dipstick NEGATIVE  NEGATIVE   Bilirubin Urine NEGATIVE  NEGATIVE   Ketones, ur NEGATIVE  NEGATIVE mg/dL   Protein, ur NEGATIVE  NEGATIVE mg/dL    Urobilinogen, UA 1.0  0.0 - 1.0 mg/dL   Nitrite NEGATIVE  NEGATIVE   Leukocytes, UA NEGATIVE  NEGATIVE   Comment: MICROSCOPIC NOT DONE ON URINES WITH NEGATIVE PROTEIN, BLOOD, LEUKOCYTES, NITRITE, OR GLUCOSE <1000 mg/dL.  URINE CULTURE     Status: None   Collection Time    10/01/13  5:17 AM      Result Value Ref Range   Specimen Description URINE, RANDOM     Special Requests NONE     Culture  Setup Time       Value: 10/01/2013 09:09     Performed at [facility]       Value: NO GROWTH     Performed at [facility]   Culture       Value: NO GROWTH     Performed at [facility]   Report Status 10/02/2013 FINAL    HEMOGLOBIN A1C     Status: None   Collection Time    10/01/13  7:00 AM      Result Value Ref Range   Hemoglobin A1C 5.3  <5.7 %   Comment: (NOTE)                                                                               According to the ADA Clinical Practice Recommendations for 2011, when     HbA1c is used as a screening test:      >=6.5%   Diagnostic of Diabetes Mellitus               (if abnormal result is confirmed)     5.7-6.4%   Increased risk of developing Diabetes Mellitus     References:Diagnosis and Classification of Diabetes Mellitus,Diabetes     QAST,4196,22([facility] 1):S62-S69 and Standards of Medical  Care in             Diabetes - 2011,Diabetes Care,2011,34 (Suppl 1):S11-S61.   Mean Plasma Glucose 105  <117 mg/dL   Comment: Performed at [facility]  GLUCOSE, CAPILLARY     Status: Abnormal   Collection Time    10/01/13  7:59 AM      Result Value Ref Range   Glucose-Capillary 167 (*) 70 - 99 mg/dL   Comment 1 Notify RN    GLUCOSE, CAPILLARY     Status: None   Collection Time    10/01/13 12:10 PM      Result Value Ref Range   Glucose-Capillary 94  70 - 99 mg/dL   Comment 1 Notify RN    GLUCOSE, CAPILLARY     Status: Abnormal   Collection Time    10/01/13  4:44 PM      Result Value Ref Range    Glucose-Capillary 104 (*) 70 - 99 mg/dL  GLUCOSE, CAPILLARY     Status: Abnormal   Collection Time    10/01/13  8:49 PM      Result Value Ref Range   Glucose-Capillary 125 (*) 70 - 99 mg/dL  CBC     Status: Abnormal   Collection Time    10/02/13  4:21 AM      Result Value Ref Range   WBC 7.4  4.0 - 10.5 K/uL   RBC 2.49 (*) 4.22 - 5.81 MIL/uL   Hemoglobin 8.9 (*) 13.0 - 17.0 g/dL   HCT 05.9 (*) 27.0 - 60.5 %   MCV 104.0 (*) 78.0 - 100.0 fL   MCH 35.7 (*) 26.0 - 34.0 pg   MCHC 34.4  30.0 - 36.0 g/dL   RDW 28.0  36.6 - 00.6 %   Platelets 150  150 - 400 K/uL  BASIC METABOLIC PANEL     Status: Abnormal   Collection Time    10/02/13  4:21 AM      Result Value Ref Range   Sodium 139  137 - 147 mEq/L   Potassium 4.1  3.7 - 5.3 mEq/L   Chloride 100  96 - 112 mEq/L   CO2 30  19 - 32 mEq/L   Glucose, Bld 100 (*) 70 - 99 mg/dL   BUN 9  6 - 23 mg/dL   Creatinine, Ser 5.90  0.50 - 1.35 mg/dL   Calcium 8.6  8.4 - 19.1 mg/dL   GFR calc non Af Amer >90  >90 mL/min   GFR calc Af Amer >90  >90 mL/min   Comment: (NOTE)     The eGFR has been calculated using the CKD EPI equation.     This calculation has not been validated in all clinical situations.     eGFR's persistently <90 mL/min signify possible Chronic Kidney     Disease.  PROTIME-INR     Status: Abnormal   Collection Time    10/02/13  4:21 AM      Result Value Ref Range   Prothrombin Time 15.8 (*) 11.6 - 15.2 seconds   INR 1.29  0.00 - 1.49  GLUCOSE, CAPILLARY     Status: None   Collection Time    10/02/13  7:45 AM      Result Value Ref Range   Glucose-Capillary 92  70 - 99 mg/dL   Comment 1 Notify RN      RADIOGRAPHIC STUDIES: I have personally reviewed the radiological images as listed and agreed with the findings in  the report.  ASSESSMENT:  History of dural sinus thrombosis  PLAN:  History of stroke I believe he had sagittal sinus thrombosis that contributed to his stroke. This was almost 10 years ago. The patient  was noncompliant with anticoagulation therapy and has not has INR checked for long time. The risk of bleeding outweigh the benefit of anticoagulation therapy. I recommend switching his anticoagulation therapy to aspirin 162 mg daily with food.   Tobacco abuse I spent some time counseling the patient the importance of tobacco cessation. he is not interested to quit.   Noncompliance This is my major concern about giving him on warfarin long-term. The patient recently had a fall to the point he broke his hip. I would not recommend future treatment with warfarin for him.  Acute blood loss anemia His last hemoglobin showed that his anemia is improving.      All questions were answered. The patient knows to call the clinic with any problems, questions or concerns. I spent 40 minutes counseling the patient face to face. The total time spent in the appointment was 55 minutes and more than 50% was on counseling.     [facility], Neita Landrigan, MD 11/07/2013 3:07 PM

## 2013-11-07 NOTE — Assessment & Plan Note (Signed)
This is my major concern about giving him on warfarin long-term. The patient recently had a fall to the point he broke his hip. I would not recommend future treatment with warfarin for him.

## 2013-11-07 NOTE — Assessment & Plan Note (Signed)
I believe he had sagittal sinus thrombosis that contributed to his stroke. This was almost 10 years ago. The patient was noncompliant with anticoagulation therapy and has not has INR checked for long time. The risk of bleeding outweigh the benefit of anticoagulation therapy. I recommend switching his anticoagulation therapy to aspirin 162 mg daily with food.

## 2013-11-07 NOTE — Progress Notes (Signed)
Checked in new patient he had no insurance card no id. He said a Education officer, museum came out and helped him do a medicaid app about 3 weeks ago. He said he is not sure if he has medicare a & b. The system has a but no card. I advised if he has to come back make sure he has cards.

## 2013-11-07 NOTE — Assessment & Plan Note (Addendum)
I spent some time counseling the patient the importance of tobacco cessation. he is not interested to quit.

## 2013-11-07 NOTE — Assessment & Plan Note (Signed)
His last hemoglobin showed that his anemia is improving.

## 2017-07-14 DEATH — deceased
